# Patient Record
Sex: Male | Born: 1935 | Race: White | Hispanic: No | Marital: Married | Smoking: Never smoker
Health system: Southern US, Community
[De-identification: ages and names within clinical notes are randomized; demographics above are authoritative.]

## PROBLEM LIST (undated history)

## (undated) DIAGNOSIS — H409 Unspecified glaucoma: Secondary | ICD-10-CM

## (undated) HISTORY — DX: Unspecified glaucoma: H40.9

---

## 2011-06-20 DIAGNOSIS — K219 Gastro-esophageal reflux disease without esophagitis: Secondary | ICD-10-CM | POA: Diagnosis not present

## 2011-06-20 DIAGNOSIS — D510 Vitamin B12 deficiency anemia due to intrinsic factor deficiency: Secondary | ICD-10-CM | POA: Diagnosis not present

## 2011-06-20 DIAGNOSIS — I4891 Unspecified atrial fibrillation: Secondary | ICD-10-CM | POA: Diagnosis not present

## 2011-06-20 DIAGNOSIS — Z79899 Other long term (current) drug therapy: Secondary | ICD-10-CM | POA: Diagnosis not present

## 2011-06-20 DIAGNOSIS — Z125 Encounter for screening for malignant neoplasm of prostate: Secondary | ICD-10-CM | POA: Diagnosis not present

## 2011-06-20 DIAGNOSIS — E785 Hyperlipidemia, unspecified: Secondary | ICD-10-CM | POA: Diagnosis not present

## 2011-07-07 DIAGNOSIS — Z6829 Body mass index (BMI) 29.0-29.9, adult: Secondary | ICD-10-CM | POA: Diagnosis not present

## 2011-07-07 DIAGNOSIS — S335XXA Sprain of ligaments of lumbar spine, initial encounter: Secondary | ICD-10-CM | POA: Diagnosis not present

## 2011-07-22 DIAGNOSIS — D510 Vitamin B12 deficiency anemia due to intrinsic factor deficiency: Secondary | ICD-10-CM | POA: Diagnosis not present

## 2011-07-24 DIAGNOSIS — J209 Acute bronchitis, unspecified: Secondary | ICD-10-CM | POA: Diagnosis not present

## 2011-07-24 DIAGNOSIS — J019 Acute sinusitis, unspecified: Secondary | ICD-10-CM | POA: Diagnosis not present

## 2011-07-24 DIAGNOSIS — Z6829 Body mass index (BMI) 29.0-29.9, adult: Secondary | ICD-10-CM | POA: Diagnosis not present

## 2011-08-20 DIAGNOSIS — D510 Vitamin B12 deficiency anemia due to intrinsic factor deficiency: Secondary | ICD-10-CM | POA: Diagnosis not present

## 2011-08-25 DIAGNOSIS — H26499 Other secondary cataract, unspecified eye: Secondary | ICD-10-CM | POA: Diagnosis not present

## 2011-08-25 DIAGNOSIS — H4011X0 Primary open-angle glaucoma, stage unspecified: Secondary | ICD-10-CM | POA: Diagnosis not present

## 2011-09-23 DIAGNOSIS — Z6828 Body mass index (BMI) 28.0-28.9, adult: Secondary | ICD-10-CM | POA: Diagnosis not present

## 2011-09-23 DIAGNOSIS — E785 Hyperlipidemia, unspecified: Secondary | ICD-10-CM | POA: Diagnosis not present

## 2011-09-23 DIAGNOSIS — I4891 Unspecified atrial fibrillation: Secondary | ICD-10-CM | POA: Diagnosis not present

## 2011-09-23 DIAGNOSIS — Z79899 Other long term (current) drug therapy: Secondary | ICD-10-CM | POA: Diagnosis not present

## 2011-09-23 DIAGNOSIS — D510 Vitamin B12 deficiency anemia due to intrinsic factor deficiency: Secondary | ICD-10-CM | POA: Diagnosis not present

## 2011-10-30 DIAGNOSIS — D510 Vitamin B12 deficiency anemia due to intrinsic factor deficiency: Secondary | ICD-10-CM | POA: Diagnosis not present

## 2011-12-01 DIAGNOSIS — D510 Vitamin B12 deficiency anemia due to intrinsic factor deficiency: Secondary | ICD-10-CM | POA: Diagnosis not present

## 2012-01-05 DIAGNOSIS — I4891 Unspecified atrial fibrillation: Secondary | ICD-10-CM | POA: Diagnosis not present

## 2012-01-05 DIAGNOSIS — E785 Hyperlipidemia, unspecified: Secondary | ICD-10-CM | POA: Diagnosis not present

## 2012-01-05 DIAGNOSIS — D510 Vitamin B12 deficiency anemia due to intrinsic factor deficiency: Secondary | ICD-10-CM | POA: Diagnosis not present

## 2012-01-05 DIAGNOSIS — Z6827 Body mass index (BMI) 27.0-27.9, adult: Secondary | ICD-10-CM | POA: Diagnosis not present

## 2012-02-06 DIAGNOSIS — D510 Vitamin B12 deficiency anemia due to intrinsic factor deficiency: Secondary | ICD-10-CM | POA: Diagnosis not present

## 2012-03-08 DIAGNOSIS — D510 Vitamin B12 deficiency anemia due to intrinsic factor deficiency: Secondary | ICD-10-CM | POA: Diagnosis not present

## 2012-04-13 DIAGNOSIS — Z6828 Body mass index (BMI) 28.0-28.9, adult: Secondary | ICD-10-CM | POA: Diagnosis not present

## 2012-04-13 DIAGNOSIS — E785 Hyperlipidemia, unspecified: Secondary | ICD-10-CM | POA: Diagnosis not present

## 2012-04-13 DIAGNOSIS — I4891 Unspecified atrial fibrillation: Secondary | ICD-10-CM | POA: Diagnosis not present

## 2012-04-13 DIAGNOSIS — D510 Vitamin B12 deficiency anemia due to intrinsic factor deficiency: Secondary | ICD-10-CM | POA: Diagnosis not present

## 2012-05-14 DIAGNOSIS — D510 Vitamin B12 deficiency anemia due to intrinsic factor deficiency: Secondary | ICD-10-CM | POA: Diagnosis not present

## 2012-05-21 DIAGNOSIS — J209 Acute bronchitis, unspecified: Secondary | ICD-10-CM | POA: Diagnosis not present

## 2012-05-21 DIAGNOSIS — Z6827 Body mass index (BMI) 27.0-27.9, adult: Secondary | ICD-10-CM | POA: Diagnosis not present

## 2012-05-21 DIAGNOSIS — H6120 Impacted cerumen, unspecified ear: Secondary | ICD-10-CM | POA: Diagnosis not present

## 2012-05-21 DIAGNOSIS — R509 Fever, unspecified: Secondary | ICD-10-CM | POA: Diagnosis not present

## 2012-05-21 DIAGNOSIS — H6690 Otitis media, unspecified, unspecified ear: Secondary | ICD-10-CM | POA: Diagnosis not present

## 2012-06-16 DIAGNOSIS — D510 Vitamin B12 deficiency anemia due to intrinsic factor deficiency: Secondary | ICD-10-CM | POA: Diagnosis not present

## 2012-07-20 DIAGNOSIS — Z125 Encounter for screening for malignant neoplasm of prostate: Secondary | ICD-10-CM | POA: Diagnosis not present

## 2012-07-20 DIAGNOSIS — D510 Vitamin B12 deficiency anemia due to intrinsic factor deficiency: Secondary | ICD-10-CM | POA: Diagnosis not present

## 2012-07-20 DIAGNOSIS — I4891 Unspecified atrial fibrillation: Secondary | ICD-10-CM | POA: Diagnosis not present

## 2012-07-20 DIAGNOSIS — Z6827 Body mass index (BMI) 27.0-27.9, adult: Secondary | ICD-10-CM | POA: Diagnosis not present

## 2012-07-20 DIAGNOSIS — E785 Hyperlipidemia, unspecified: Secondary | ICD-10-CM | POA: Diagnosis not present

## 2012-08-19 DIAGNOSIS — D510 Vitamin B12 deficiency anemia due to intrinsic factor deficiency: Secondary | ICD-10-CM | POA: Diagnosis not present

## 2012-10-28 DIAGNOSIS — D510 Vitamin B12 deficiency anemia due to intrinsic factor deficiency: Secondary | ICD-10-CM | POA: Diagnosis not present

## 2012-10-28 DIAGNOSIS — I4891 Unspecified atrial fibrillation: Secondary | ICD-10-CM | POA: Diagnosis not present

## 2012-10-28 DIAGNOSIS — E785 Hyperlipidemia, unspecified: Secondary | ICD-10-CM | POA: Diagnosis not present

## 2012-10-28 DIAGNOSIS — Z6828 Body mass index (BMI) 28.0-28.9, adult: Secondary | ICD-10-CM | POA: Diagnosis not present

## 2012-11-29 DIAGNOSIS — D510 Vitamin B12 deficiency anemia due to intrinsic factor deficiency: Secondary | ICD-10-CM | POA: Diagnosis not present

## 2012-12-10 DIAGNOSIS — H409 Unspecified glaucoma: Secondary | ICD-10-CM | POA: Diagnosis not present

## 2012-12-10 DIAGNOSIS — H26499 Other secondary cataract, unspecified eye: Secondary | ICD-10-CM | POA: Diagnosis not present

## 2012-12-10 DIAGNOSIS — H4011X0 Primary open-angle glaucoma, stage unspecified: Secondary | ICD-10-CM | POA: Diagnosis not present

## 2012-12-30 DIAGNOSIS — D510 Vitamin B12 deficiency anemia due to intrinsic factor deficiency: Secondary | ICD-10-CM | POA: Diagnosis not present

## 2013-02-04 DIAGNOSIS — Z6828 Body mass index (BMI) 28.0-28.9, adult: Secondary | ICD-10-CM | POA: Diagnosis not present

## 2013-02-04 DIAGNOSIS — Z1331 Encounter for screening for depression: Secondary | ICD-10-CM | POA: Diagnosis not present

## 2013-02-04 DIAGNOSIS — D510 Vitamin B12 deficiency anemia due to intrinsic factor deficiency: Secondary | ICD-10-CM | POA: Diagnosis not present

## 2013-02-04 DIAGNOSIS — E785 Hyperlipidemia, unspecified: Secondary | ICD-10-CM | POA: Diagnosis not present

## 2013-02-04 DIAGNOSIS — K219 Gastro-esophageal reflux disease without esophagitis: Secondary | ICD-10-CM | POA: Diagnosis not present

## 2013-02-04 DIAGNOSIS — Z9181 History of falling: Secondary | ICD-10-CM | POA: Diagnosis not present

## 2013-02-04 DIAGNOSIS — I4891 Unspecified atrial fibrillation: Secondary | ICD-10-CM | POA: Diagnosis not present

## 2013-05-06 DIAGNOSIS — E785 Hyperlipidemia, unspecified: Secondary | ICD-10-CM | POA: Diagnosis not present

## 2013-05-06 DIAGNOSIS — D510 Vitamin B12 deficiency anemia due to intrinsic factor deficiency: Secondary | ICD-10-CM | POA: Diagnosis not present

## 2013-05-06 DIAGNOSIS — I4891 Unspecified atrial fibrillation: Secondary | ICD-10-CM | POA: Diagnosis not present

## 2013-05-06 DIAGNOSIS — Z6828 Body mass index (BMI) 28.0-28.9, adult: Secondary | ICD-10-CM | POA: Diagnosis not present

## 2013-06-13 DIAGNOSIS — H26499 Other secondary cataract, unspecified eye: Secondary | ICD-10-CM | POA: Diagnosis not present

## 2013-06-13 DIAGNOSIS — H4011X0 Primary open-angle glaucoma, stage unspecified: Secondary | ICD-10-CM | POA: Diagnosis not present

## 2013-08-08 DIAGNOSIS — D510 Vitamin B12 deficiency anemia due to intrinsic factor deficiency: Secondary | ICD-10-CM | POA: Diagnosis not present

## 2013-08-08 DIAGNOSIS — E785 Hyperlipidemia, unspecified: Secondary | ICD-10-CM | POA: Diagnosis not present

## 2013-08-08 DIAGNOSIS — I4891 Unspecified atrial fibrillation: Secondary | ICD-10-CM | POA: Diagnosis not present

## 2013-08-08 DIAGNOSIS — Z6829 Body mass index (BMI) 29.0-29.9, adult: Secondary | ICD-10-CM | POA: Diagnosis not present

## 2013-09-20 DIAGNOSIS — J309 Allergic rhinitis, unspecified: Secondary | ICD-10-CM | POA: Diagnosis not present

## 2013-09-20 DIAGNOSIS — J209 Acute bronchitis, unspecified: Secondary | ICD-10-CM | POA: Diagnosis not present

## 2013-09-20 DIAGNOSIS — Z6829 Body mass index (BMI) 29.0-29.9, adult: Secondary | ICD-10-CM | POA: Diagnosis not present

## 2013-10-17 DIAGNOSIS — R0982 Postnasal drip: Secondary | ICD-10-CM | POA: Diagnosis not present

## 2013-10-17 DIAGNOSIS — H6120 Impacted cerumen, unspecified ear: Secondary | ICD-10-CM | POA: Diagnosis not present

## 2013-10-17 DIAGNOSIS — J309 Allergic rhinitis, unspecified: Secondary | ICD-10-CM | POA: Diagnosis not present

## 2013-10-17 DIAGNOSIS — Z6828 Body mass index (BMI) 28.0-28.9, adult: Secondary | ICD-10-CM | POA: Diagnosis not present

## 2013-10-17 DIAGNOSIS — J019 Acute sinusitis, unspecified: Secondary | ICD-10-CM | POA: Diagnosis not present

## 2013-11-16 DIAGNOSIS — D510 Vitamin B12 deficiency anemia due to intrinsic factor deficiency: Secondary | ICD-10-CM | POA: Diagnosis not present

## 2013-11-16 DIAGNOSIS — E785 Hyperlipidemia, unspecified: Secondary | ICD-10-CM | POA: Diagnosis not present

## 2013-11-16 DIAGNOSIS — Z125 Encounter for screening for malignant neoplasm of prostate: Secondary | ICD-10-CM | POA: Diagnosis not present

## 2013-11-16 DIAGNOSIS — Z6828 Body mass index (BMI) 28.0-28.9, adult: Secondary | ICD-10-CM | POA: Diagnosis not present

## 2013-11-16 DIAGNOSIS — I4891 Unspecified atrial fibrillation: Secondary | ICD-10-CM | POA: Diagnosis not present

## 2013-12-13 DIAGNOSIS — H47239 Glaucomatous optic atrophy, unspecified eye: Secondary | ICD-10-CM | POA: Diagnosis not present

## 2014-02-22 DIAGNOSIS — I4891 Unspecified atrial fibrillation: Secondary | ICD-10-CM | POA: Diagnosis not present

## 2014-02-22 DIAGNOSIS — E785 Hyperlipidemia, unspecified: Secondary | ICD-10-CM | POA: Diagnosis not present

## 2014-02-22 DIAGNOSIS — Z6829 Body mass index (BMI) 29.0-29.9, adult: Secondary | ICD-10-CM | POA: Diagnosis not present

## 2014-02-22 DIAGNOSIS — D510 Vitamin B12 deficiency anemia due to intrinsic factor deficiency: Secondary | ICD-10-CM | POA: Diagnosis not present

## 2014-06-05 DIAGNOSIS — E785 Hyperlipidemia, unspecified: Secondary | ICD-10-CM | POA: Diagnosis not present

## 2014-06-05 DIAGNOSIS — Z6829 Body mass index (BMI) 29.0-29.9, adult: Secondary | ICD-10-CM | POA: Diagnosis not present

## 2014-06-05 DIAGNOSIS — Z9181 History of falling: Secondary | ICD-10-CM | POA: Diagnosis not present

## 2014-06-05 DIAGNOSIS — K219 Gastro-esophageal reflux disease without esophagitis: Secondary | ICD-10-CM | POA: Diagnosis not present

## 2014-06-05 DIAGNOSIS — Z2821 Immunization not carried out because of patient refusal: Secondary | ICD-10-CM | POA: Diagnosis not present

## 2014-06-05 DIAGNOSIS — E538 Deficiency of other specified B group vitamins: Secondary | ICD-10-CM | POA: Diagnosis not present

## 2014-06-05 DIAGNOSIS — Z1389 Encounter for screening for other disorder: Secondary | ICD-10-CM | POA: Diagnosis not present

## 2014-06-05 DIAGNOSIS — Z720 Tobacco use: Secondary | ICD-10-CM | POA: Diagnosis not present

## 2014-06-05 DIAGNOSIS — I4891 Unspecified atrial fibrillation: Secondary | ICD-10-CM | POA: Diagnosis not present

## 2014-06-07 DIAGNOSIS — J209 Acute bronchitis, unspecified: Secondary | ICD-10-CM | POA: Diagnosis not present

## 2014-06-07 DIAGNOSIS — Z6829 Body mass index (BMI) 29.0-29.9, adult: Secondary | ICD-10-CM | POA: Diagnosis not present

## 2014-06-20 DIAGNOSIS — H4011X3 Primary open-angle glaucoma, severe stage: Secondary | ICD-10-CM | POA: Diagnosis not present

## 2014-06-29 DIAGNOSIS — Z6829 Body mass index (BMI) 29.0-29.9, adult: Secondary | ICD-10-CM | POA: Diagnosis not present

## 2014-06-29 DIAGNOSIS — J019 Acute sinusitis, unspecified: Secondary | ICD-10-CM | POA: Diagnosis not present

## 2014-06-29 DIAGNOSIS — J208 Acute bronchitis due to other specified organisms: Secondary | ICD-10-CM | POA: Diagnosis not present

## 2014-08-04 DIAGNOSIS — R05 Cough: Secondary | ICD-10-CM | POA: Diagnosis not present

## 2014-08-04 DIAGNOSIS — J449 Chronic obstructive pulmonary disease, unspecified: Secondary | ICD-10-CM | POA: Diagnosis not present

## 2014-08-04 DIAGNOSIS — Z6828 Body mass index (BMI) 28.0-28.9, adult: Secondary | ICD-10-CM | POA: Diagnosis not present

## 2014-08-04 DIAGNOSIS — J208 Acute bronchitis due to other specified organisms: Secondary | ICD-10-CM | POA: Diagnosis not present

## 2014-09-06 DIAGNOSIS — Z6829 Body mass index (BMI) 29.0-29.9, adult: Secondary | ICD-10-CM | POA: Diagnosis not present

## 2014-09-06 DIAGNOSIS — Z720 Tobacco use: Secondary | ICD-10-CM | POA: Diagnosis not present

## 2014-09-06 DIAGNOSIS — E538 Deficiency of other specified B group vitamins: Secondary | ICD-10-CM | POA: Diagnosis not present

## 2014-09-06 DIAGNOSIS — E785 Hyperlipidemia, unspecified: Secondary | ICD-10-CM | POA: Diagnosis not present

## 2014-09-06 DIAGNOSIS — K219 Gastro-esophageal reflux disease without esophagitis: Secondary | ICD-10-CM | POA: Diagnosis not present

## 2014-09-06 DIAGNOSIS — I480 Paroxysmal atrial fibrillation: Secondary | ICD-10-CM | POA: Diagnosis not present

## 2014-12-08 DIAGNOSIS — K219 Gastro-esophageal reflux disease without esophagitis: Secondary | ICD-10-CM | POA: Diagnosis not present

## 2014-12-08 DIAGNOSIS — Z9181 History of falling: Secondary | ICD-10-CM | POA: Diagnosis not present

## 2014-12-08 DIAGNOSIS — E538 Deficiency of other specified B group vitamins: Secondary | ICD-10-CM | POA: Diagnosis not present

## 2014-12-08 DIAGNOSIS — Z125 Encounter for screening for malignant neoplasm of prostate: Secondary | ICD-10-CM | POA: Diagnosis not present

## 2014-12-08 DIAGNOSIS — I480 Paroxysmal atrial fibrillation: Secondary | ICD-10-CM | POA: Diagnosis not present

## 2014-12-08 DIAGNOSIS — L02211 Cutaneous abscess of abdominal wall: Secondary | ICD-10-CM | POA: Diagnosis not present

## 2014-12-08 DIAGNOSIS — Z79899 Other long term (current) drug therapy: Secondary | ICD-10-CM | POA: Diagnosis not present

## 2014-12-08 DIAGNOSIS — Z1389 Encounter for screening for other disorder: Secondary | ICD-10-CM | POA: Diagnosis not present

## 2014-12-08 DIAGNOSIS — E785 Hyperlipidemia, unspecified: Secondary | ICD-10-CM | POA: Diagnosis not present

## 2014-12-08 DIAGNOSIS — Z6829 Body mass index (BMI) 29.0-29.9, adult: Secondary | ICD-10-CM | POA: Diagnosis not present

## 2014-12-08 DIAGNOSIS — Z720 Tobacco use: Secondary | ICD-10-CM | POA: Diagnosis not present

## 2015-01-11 DIAGNOSIS — L821 Other seborrheic keratosis: Secondary | ICD-10-CM | POA: Diagnosis not present

## 2015-01-11 DIAGNOSIS — L820 Inflamed seborrheic keratosis: Secondary | ICD-10-CM | POA: Diagnosis not present

## 2015-01-11 DIAGNOSIS — L578 Other skin changes due to chronic exposure to nonionizing radiation: Secondary | ICD-10-CM | POA: Diagnosis not present

## 2015-01-11 DIAGNOSIS — L720 Epidermal cyst: Secondary | ICD-10-CM | POA: Diagnosis not present

## 2015-02-08 DIAGNOSIS — Z6829 Body mass index (BMI) 29.0-29.9, adult: Secondary | ICD-10-CM | POA: Diagnosis not present

## 2015-02-08 DIAGNOSIS — J019 Acute sinusitis, unspecified: Secondary | ICD-10-CM | POA: Diagnosis not present

## 2015-02-08 DIAGNOSIS — J309 Allergic rhinitis, unspecified: Secondary | ICD-10-CM | POA: Diagnosis not present

## 2015-02-19 DIAGNOSIS — H4011X3 Primary open-angle glaucoma, severe stage: Secondary | ICD-10-CM | POA: Diagnosis not present

## 2015-03-09 DIAGNOSIS — H401132 Primary open-angle glaucoma, bilateral, moderate stage: Secondary | ICD-10-CM | POA: Diagnosis not present

## 2015-03-13 DIAGNOSIS — L820 Inflamed seborrheic keratosis: Secondary | ICD-10-CM | POA: Diagnosis not present

## 2015-03-13 DIAGNOSIS — Z720 Tobacco use: Secondary | ICD-10-CM | POA: Diagnosis not present

## 2015-03-13 DIAGNOSIS — E785 Hyperlipidemia, unspecified: Secondary | ICD-10-CM | POA: Diagnosis not present

## 2015-03-13 DIAGNOSIS — D519 Vitamin B12 deficiency anemia, unspecified: Secondary | ICD-10-CM | POA: Diagnosis not present

## 2015-03-13 DIAGNOSIS — L821 Other seborrheic keratosis: Secondary | ICD-10-CM | POA: Diagnosis not present

## 2015-03-13 DIAGNOSIS — I480 Paroxysmal atrial fibrillation: Secondary | ICD-10-CM | POA: Diagnosis not present

## 2015-03-13 DIAGNOSIS — D234 Other benign neoplasm of skin of scalp and neck: Secondary | ICD-10-CM | POA: Diagnosis not present

## 2015-03-13 DIAGNOSIS — L578 Other skin changes due to chronic exposure to nonionizing radiation: Secondary | ICD-10-CM | POA: Diagnosis not present

## 2015-03-13 DIAGNOSIS — Z6828 Body mass index (BMI) 28.0-28.9, adult: Secondary | ICD-10-CM | POA: Diagnosis not present

## 2015-03-27 DIAGNOSIS — H401133 Primary open-angle glaucoma, bilateral, severe stage: Secondary | ICD-10-CM | POA: Diagnosis not present

## 2015-04-16 ENCOUNTER — Ambulatory Visit (INDEPENDENT_AMBULATORY_CARE_PROVIDER_SITE_OTHER): Payer: Medicare Other | Admitting: Podiatry

## 2015-04-16 ENCOUNTER — Encounter: Payer: Self-pay | Admitting: Podiatry

## 2015-04-16 DIAGNOSIS — M79676 Pain in unspecified toe(s): Secondary | ICD-10-CM | POA: Diagnosis not present

## 2015-04-16 DIAGNOSIS — B351 Tinea unguium: Secondary | ICD-10-CM | POA: Diagnosis not present

## 2015-04-16 NOTE — Progress Notes (Signed)
   Subjective:    Patient ID: Lucas Miller, male    DOB: 1934-06-24, 80 y.o.   MRN: [id number]  HPIThis patient presents to the office with long painful nails.  His nails both feet are painful walking and wearing his shoes.  His wife says she stopped cutting the nails and he has not worked on the nails himself.  He presents to the office for evaluation and treatment. The patient is here today with B/l 2nd toenails that are discolored and for B/L toenail trim.   Review of Systems  HENT:       Sinus problems  Respiratory: Positive for cough.        Objective:   Physical Exam GENERAL APPEARANCE: Alert, conversant. Appropriately groomed. No acute distress.  VASCULAR: Pedal pulses palpable at  [facility] and PT bilateral.  Capillary refill time is immediate to all digits,  Normal temperature gradient.  Digital hair growth is present bilateral  NEUROLOGIC: sensation is normal to 5.07 monofilament at 5/5 sites bilateral.  Light touch is intact bilateral, Muscle strength normal.  MUSCULOSKELETAL: acceptable muscle strength, tone and stability bilateral.  Intrinsic muscluature intact bilateral.  Rectus appearance of foot and digits noted bilateral.   DERMATOLOGIC: skin color, texture, and turgor are within normal limits.  No preulcerative lesions or ulcers  are seen, no interdigital maceration noted.  No open lesions present.  . No drainage noted.  NAILS  Thick disfigured discolored nails both feet especially second toenails both feet.       Assessment & Plan:  Onychomycosis    IE   Debridement and grinding of long painful nails.    Gardiner Barefoot DPM

## 2015-05-22 DIAGNOSIS — Z720 Tobacco use: Secondary | ICD-10-CM | POA: Diagnosis not present

## 2015-05-22 DIAGNOSIS — J208 Acute bronchitis due to other specified organisms: Secondary | ICD-10-CM | POA: Diagnosis not present

## 2015-05-22 DIAGNOSIS — Z6829 Body mass index (BMI) 29.0-29.9, adult: Secondary | ICD-10-CM | POA: Diagnosis not present

## 2015-06-15 DIAGNOSIS — K219 Gastro-esophageal reflux disease without esophagitis: Secondary | ICD-10-CM | POA: Diagnosis not present

## 2015-06-15 DIAGNOSIS — E785 Hyperlipidemia, unspecified: Secondary | ICD-10-CM | POA: Diagnosis not present

## 2015-06-15 DIAGNOSIS — Z6828 Body mass index (BMI) 28.0-28.9, adult: Secondary | ICD-10-CM | POA: Diagnosis not present

## 2015-06-15 DIAGNOSIS — I480 Paroxysmal atrial fibrillation: Secondary | ICD-10-CM | POA: Diagnosis not present

## 2015-06-15 DIAGNOSIS — Z720 Tobacco use: Secondary | ICD-10-CM | POA: Diagnosis not present

## 2015-06-15 DIAGNOSIS — D519 Vitamin B12 deficiency anemia, unspecified: Secondary | ICD-10-CM | POA: Diagnosis not present

## 2015-07-23 ENCOUNTER — Ambulatory Visit (INDEPENDENT_AMBULATORY_CARE_PROVIDER_SITE_OTHER): Payer: Medicare Other | Admitting: Podiatry

## 2015-07-23 ENCOUNTER — Encounter: Payer: Self-pay | Admitting: Podiatry

## 2015-07-23 DIAGNOSIS — B351 Tinea unguium: Secondary | ICD-10-CM | POA: Diagnosis not present

## 2015-07-23 DIAGNOSIS — M79676 Pain in unspecified toe(s): Secondary | ICD-10-CM

## 2015-07-23 NOTE — Progress Notes (Signed)
   Subjective:    Patient ID: Lucas Miller, male    DOB: 1934/07/19, 81 y.o.   MRN: [id number]  HPIThis patient presents to the office with long painful nails.  His nails both feet are painful walking and wearing his shoes.  His wife says she stopped cutting the nails and he has not worked on the nails himself.  He presents to the office for evaluation and treatment. The patient is here today with B/l 2nd toenails that are discolored and for B/L toenail trim.   Review of Systems  HENT:       Sinus problems  Respiratory: Positive for cough.        Objective:   Physical Exam GENERAL APPEARANCE: Alert, conversant. Appropriately groomed. No acute distress.  VASCULAR: Pedal pulses palpable at  [facility] and PT bilateral.  Capillary refill time is immediate to all digits,  Normal temperature gradient.  Digital hair growth is present bilateral  NEUROLOGIC: sensation is normal to 5.07 monofilament at 5/5 sites bilateral.  Light touch is intact bilateral, Muscle strength normal.  MUSCULOSKELETAL: acceptable muscle strength, tone and stability bilateral.  Intrinsic muscluature intact bilateral.  Rectus appearance of foot and digits noted bilateral.   DERMATOLOGIC: skin color, texture, and turgor are within normal limits.  No preulcerative lesions or ulcers  are seen, no interdigital maceration noted.  No open lesions present.  . No drainage noted.  NAILS  Thick disfigured discolored nails both feet especially second toenails both feet.       Assessment & Plan:  Onychomycosis    IE   Debridement and grinding of long painful nails.    Gardiner Barefoot DPM

## 2015-07-30 DIAGNOSIS — J101 Influenza due to other identified influenza virus with other respiratory manifestations: Secondary | ICD-10-CM | POA: Diagnosis not present

## 2015-07-30 DIAGNOSIS — Z6828 Body mass index (BMI) 28.0-28.9, adult: Secondary | ICD-10-CM | POA: Diagnosis not present

## 2015-08-17 DIAGNOSIS — J208 Acute bronchitis due to other specified organisms: Secondary | ICD-10-CM | POA: Diagnosis not present

## 2015-08-17 DIAGNOSIS — Z6828 Body mass index (BMI) 28.0-28.9, adult: Secondary | ICD-10-CM | POA: Diagnosis not present

## 2015-08-17 DIAGNOSIS — Z720 Tobacco use: Secondary | ICD-10-CM | POA: Diagnosis not present

## 2015-09-21 DIAGNOSIS — E663 Overweight: Secondary | ICD-10-CM | POA: Diagnosis not present

## 2015-09-21 DIAGNOSIS — R05 Cough: Secondary | ICD-10-CM | POA: Diagnosis not present

## 2015-09-21 DIAGNOSIS — R918 Other nonspecific abnormal finding of lung field: Secondary | ICD-10-CM | POA: Diagnosis not present

## 2015-09-21 DIAGNOSIS — I480 Paroxysmal atrial fibrillation: Secondary | ICD-10-CM | POA: Diagnosis not present

## 2015-09-21 DIAGNOSIS — E785 Hyperlipidemia, unspecified: Secondary | ICD-10-CM | POA: Diagnosis not present

## 2015-09-21 DIAGNOSIS — K219 Gastro-esophageal reflux disease without esophagitis: Secondary | ICD-10-CM | POA: Diagnosis not present

## 2015-09-21 DIAGNOSIS — Z720 Tobacco use: Secondary | ICD-10-CM | POA: Diagnosis not present

## 2015-09-21 DIAGNOSIS — H6123 Impacted cerumen, bilateral: Secondary | ICD-10-CM | POA: Diagnosis not present

## 2015-09-21 DIAGNOSIS — Z6827 Body mass index (BMI) 27.0-27.9, adult: Secondary | ICD-10-CM | POA: Diagnosis not present

## 2015-09-21 DIAGNOSIS — D519 Vitamin B12 deficiency anemia, unspecified: Secondary | ICD-10-CM | POA: Diagnosis not present

## 2015-09-25 DIAGNOSIS — H401133 Primary open-angle glaucoma, bilateral, severe stage: Secondary | ICD-10-CM | POA: Diagnosis not present

## 2015-10-01 ENCOUNTER — Ambulatory Visit (INDEPENDENT_AMBULATORY_CARE_PROVIDER_SITE_OTHER): Payer: Medicare Other | Admitting: Podiatry

## 2015-10-01 ENCOUNTER — Encounter: Payer: Self-pay | Admitting: Podiatry

## 2015-10-01 DIAGNOSIS — M79676 Pain in unspecified toe(s): Secondary | ICD-10-CM | POA: Diagnosis not present

## 2015-10-01 DIAGNOSIS — B351 Tinea unguium: Secondary | ICD-10-CM | POA: Diagnosis not present

## 2015-10-01 NOTE — Progress Notes (Signed)
   Subjective:    Patient ID: Lucas Miller, male    DOB: 1934/09/23, 81 y.o.   MRN: [id number]  HPIThis patient presents to the office with long painful nails.  His nails both feet are painful walking and wearing his shoes.  His wife says she stopped cutting the nails and he has not worked on the nails himself.  He presents to the office for evaluation and treatment. The patient is here today with B/l 2nd toenails that are discolored and for B/L toenail trim.   Review of Systems  HENT:       Sinus problems  Respiratory: Positive for cough.        Objective:   Physical Exam GENERAL APPEARANCE: Alert, conversant. Appropriately groomed. No acute distress.  VASCULAR: Pedal pulses palpable at  [facility] and PT bilateral.  Capillary refill time is immediate to all digits,  Normal temperature gradient.  Digital hair growth is present bilateral  NEUROLOGIC: sensation is normal to 5.07 monofilament at 5/5 sites bilateral.  Light touch is intact bilateral, Muscle strength normal.  MUSCULOSKELETAL: acceptable muscle strength, tone and stability bilateral.  Intrinsic muscluature intact bilateral.  Rectus appearance of foot and digits noted bilateral.   DERMATOLOGIC: skin color, texture, and turgor are within normal limits.  No preulcerative lesions or ulcers  are seen, no interdigital maceration noted.  No open lesions present.  . No drainage noted.  NAILS  Thick disfigured discolored nails both feet especially second toenails both feet.       Assessment & Plan:  Onychomycosis    IE   Debridement and grinding of long painful nails.    Gardiner Barefoot DPM

## 2015-12-10 ENCOUNTER — Encounter: Payer: Self-pay | Admitting: Podiatry

## 2015-12-10 ENCOUNTER — Ambulatory Visit (INDEPENDENT_AMBULATORY_CARE_PROVIDER_SITE_OTHER): Payer: Medicare Other | Admitting: Podiatry

## 2015-12-10 DIAGNOSIS — M79676 Pain in unspecified toe(s): Secondary | ICD-10-CM | POA: Diagnosis not present

## 2015-12-10 DIAGNOSIS — B351 Tinea unguium: Secondary | ICD-10-CM | POA: Diagnosis not present

## 2015-12-10 NOTE — Progress Notes (Signed)
   Subjective:    Patient ID: Lucas Miller, male    DOB: 1935-04-29, 81 y.o.   MRN: [id number]  HPIThis patient presents to the office with long painful nails.  His nails both feet are painful walking and wearing his shoes.  His wife says she stopped cutting the nails and he has not worked on the nails himself.  He presents to the office for evaluation and treatment. The patient is here today with B/l 2nd toenails that are discolored and for B/L toenail trim.   Review of Systems  HENT:       Sinus problems  Respiratory: Positive for cough.        Objective:   Physical Exam GENERAL APPEARANCE: Alert, conversant. Appropriately groomed. No acute distress.  VASCULAR: Pedal pulses palpable at  [facility] and PT bilateral.  Capillary refill time is immediate to all digits,  Normal temperature gradient.  Digital hair growth is present bilateral  NEUROLOGIC: sensation is normal to 5.07 monofilament at 5/5 sites bilateral.  Light touch is intact bilateral, Muscle strength normal.  MUSCULOSKELETAL: acceptable muscle strength, tone and stability bilateral.  Intrinsic muscluature intact bilateral.  Rectus appearance of foot and digits noted bilateral.   DERMATOLOGIC: skin color, texture, and turgor are within normal limits.  No preulcerative lesions or ulcers  are seen, no interdigital maceration noted.  No open lesions present.  . No drainage noted.  NAILS  Thick disfigured discolored nails both feet especially second toenails both feet.       Assessment & Plan:  Onychomycosis    IE   Debridement and grinding of long painful nails.    Gardiner Barefoot DPM

## 2015-12-25 DIAGNOSIS — Z720 Tobacco use: Secondary | ICD-10-CM | POA: Diagnosis not present

## 2015-12-25 DIAGNOSIS — Z125 Encounter for screening for malignant neoplasm of prostate: Secondary | ICD-10-CM | POA: Diagnosis not present

## 2015-12-25 DIAGNOSIS — E785 Hyperlipidemia, unspecified: Secondary | ICD-10-CM | POA: Diagnosis not present

## 2015-12-25 DIAGNOSIS — I480 Paroxysmal atrial fibrillation: Secondary | ICD-10-CM | POA: Diagnosis not present

## 2015-12-25 DIAGNOSIS — Z6829 Body mass index (BMI) 29.0-29.9, adult: Secondary | ICD-10-CM | POA: Diagnosis not present

## 2015-12-25 DIAGNOSIS — D519 Vitamin B12 deficiency anemia, unspecified: Secondary | ICD-10-CM | POA: Diagnosis not present

## 2015-12-25 DIAGNOSIS — K219 Gastro-esophageal reflux disease without esophagitis: Secondary | ICD-10-CM | POA: Diagnosis not present

## 2016-01-29 DIAGNOSIS — H401133 Primary open-angle glaucoma, bilateral, severe stage: Secondary | ICD-10-CM | POA: Diagnosis not present

## 2016-01-29 DIAGNOSIS — H53461 Homonymous bilateral field defects, right side: Secondary | ICD-10-CM | POA: Diagnosis not present

## 2016-02-18 ENCOUNTER — Ambulatory Visit (INDEPENDENT_AMBULATORY_CARE_PROVIDER_SITE_OTHER): Payer: Medicare Other | Admitting: Podiatry

## 2016-02-18 ENCOUNTER — Encounter: Payer: Self-pay | Admitting: Podiatry

## 2016-02-18 VITALS — Ht 72.0 in | Wt 195.0 lb

## 2016-02-18 DIAGNOSIS — M79676 Pain in unspecified toe(s): Secondary | ICD-10-CM

## 2016-02-18 DIAGNOSIS — B351 Tinea unguium: Secondary | ICD-10-CM

## 2016-02-18 NOTE — Progress Notes (Signed)
   Subjective:    Patient ID: Lucas Miller, male    DOB: [birth year], 81 y.o.   MRN: [id number]  HPIThis patient presents to the office with long painful nails.  His nails both feet are painful walking and wearing his shoes.  His wife says she stopped cutting the nails and he has not worked on the nails himself.  He presents to the office for evaluation and treatment. The patient is here today with B/l 2nd toenails that are discolored and for B/L toenail trim.   Review of Systems  HENT:       Sinus problems  Respiratory: Positive for cough.        Objective:   Physical Exam GENERAL APPEARANCE: Alert, conversant. Appropriately groomed. No acute distress.  VASCULAR: Pedal pulses palpable at  [facility] and PT bilateral.  Capillary refill time is immediate to all digits,  Normal temperature gradient.  Digital hair growth is present bilateral  NEUROLOGIC: sensation is normal to 5.07 monofilament at 5/5 sites bilateral.  Light touch is intact bilateral, Muscle strength normal.  MUSCULOSKELETAL: acceptable muscle strength, tone and stability bilateral.  Intrinsic muscluature intact bilateral.  Rectus appearance of foot and digits noted bilateral.   DERMATOLOGIC: skin color, texture, and turgor are within normal limits.  No preulcerative lesions or ulcers  are seen, no interdigital maceration noted.  No open lesions present.  . No drainage noted.  NAILS  Thick disfigured discolored nails both feet especially second toenails both feet.       Assessment & Plan:  Onychomycosis    IE   Debridement and grinding of long painful nails.    Gardiner Barefoot DPM

## 2016-02-21 DIAGNOSIS — Z6828 Body mass index (BMI) 28.0-28.9, adult: Secondary | ICD-10-CM | POA: Diagnosis not present

## 2016-02-21 DIAGNOSIS — J208 Acute bronchitis due to other specified organisms: Secondary | ICD-10-CM | POA: Diagnosis not present

## 2016-02-21 DIAGNOSIS — Z720 Tobacco use: Secondary | ICD-10-CM | POA: Diagnosis not present

## 2016-02-21 DIAGNOSIS — H1013 Acute atopic conjunctivitis, bilateral: Secondary | ICD-10-CM | POA: Diagnosis not present

## 2016-03-19 DIAGNOSIS — H401133 Primary open-angle glaucoma, bilateral, severe stage: Secondary | ICD-10-CM | POA: Diagnosis not present

## 2016-03-31 DIAGNOSIS — L03115 Cellulitis of right lower limb: Secondary | ICD-10-CM | POA: Diagnosis not present

## 2016-03-31 DIAGNOSIS — Z6828 Body mass index (BMI) 28.0-28.9, adult: Secondary | ICD-10-CM | POA: Diagnosis not present

## 2016-03-31 DIAGNOSIS — Z720 Tobacco use: Secondary | ICD-10-CM | POA: Diagnosis not present

## 2016-03-31 DIAGNOSIS — J208 Acute bronchitis due to other specified organisms: Secondary | ICD-10-CM | POA: Diagnosis not present

## 2016-04-02 DIAGNOSIS — D519 Vitamin B12 deficiency anemia, unspecified: Secondary | ICD-10-CM | POA: Diagnosis not present

## 2016-04-02 DIAGNOSIS — L03115 Cellulitis of right lower limb: Secondary | ICD-10-CM | POA: Diagnosis not present

## 2016-04-02 DIAGNOSIS — Z9181 History of falling: Secondary | ICD-10-CM | POA: Diagnosis not present

## 2016-04-02 DIAGNOSIS — J208 Acute bronchitis due to other specified organisms: Secondary | ICD-10-CM | POA: Diagnosis not present

## 2016-04-02 DIAGNOSIS — Z6827 Body mass index (BMI) 27.0-27.9, adult: Secondary | ICD-10-CM | POA: Diagnosis not present

## 2016-04-02 DIAGNOSIS — Z720 Tobacco use: Secondary | ICD-10-CM | POA: Diagnosis not present

## 2016-04-02 DIAGNOSIS — Z1389 Encounter for screening for other disorder: Secondary | ICD-10-CM | POA: Diagnosis not present

## 2016-04-02 DIAGNOSIS — E785 Hyperlipidemia, unspecified: Secondary | ICD-10-CM | POA: Diagnosis not present

## 2016-04-02 DIAGNOSIS — K219 Gastro-esophageal reflux disease without esophagitis: Secondary | ICD-10-CM | POA: Diagnosis not present

## 2016-04-02 DIAGNOSIS — I480 Paroxysmal atrial fibrillation: Secondary | ICD-10-CM | POA: Diagnosis not present

## 2016-04-28 ENCOUNTER — Ambulatory Visit: Payer: Medicare Other | Admitting: Podiatry

## 2016-04-28 ENCOUNTER — Ambulatory Visit: Payer: No Typology Code available for payment source | Admitting: Podiatry

## 2016-05-07 ENCOUNTER — Ambulatory Visit (INDEPENDENT_AMBULATORY_CARE_PROVIDER_SITE_OTHER): Payer: Medicare Other | Admitting: Sports Medicine

## 2016-05-07 ENCOUNTER — Encounter: Payer: Self-pay | Admitting: Sports Medicine

## 2016-05-07 DIAGNOSIS — M79676 Pain in unspecified toe(s): Secondary | ICD-10-CM | POA: Diagnosis not present

## 2016-05-07 DIAGNOSIS — B351 Tinea unguium: Secondary | ICD-10-CM | POA: Diagnosis not present

## 2016-05-07 DIAGNOSIS — I739 Peripheral vascular disease, unspecified: Secondary | ICD-10-CM

## 2016-05-07 NOTE — Progress Notes (Signed)
Subjective: Lucas Miller is a 81 y.o. male patient seen today in office with complaint of painful thickened and elongated toenails; unable to trim. Patient denies history of Diabetes, Neuropathy, or known Vascular disease. Patient has no other pedal complaints at this time.   There are no active problems to display for this patient.   Current Outpatient Prescriptions on File Prior to Visit  Medication Sig Dispense Refill  . aspirin 81 MG tablet Take 81 mg by mouth daily.    . COMBIGAN 0.2-0.5 % ophthalmic solution PUT 1 DROP INTO INTO BOTH EYES TWICE A DAY  5  . FOLPLEX 2.2 2.2-25-0.5 MG TABS Take 1 tablet by mouth daily.  12  . LUMIGAN 0.01 % SOLN PLACE 1 DROP IN EACH AFFECTED EYE(S) DAILY IN THE EVENING  12  . omeprazole (PRILOSEC) 20 MG capsule Take 20 mg by mouth 2 (two) times daily.  12  . simvastatin (ZOCOR) 40 MG tablet Take 40 mg by mouth at bedtime.  5   No current facility-administered medications on file prior to visit.     Allergies  Allergen Reactions  . Codeine   . Sulfa Antibiotics     Objective: Physical Exam  General: Well developed, nourished, no acute distress, awake, alert and oriented x 3  Vascular: Dorsalis pedis artery 1 out of 4 bilateral, Posterior tibial artery 1 out of 4 bilateral, skin temperature warm to warm proximal to distal bilateral lower extremities, there is varicosities, scant pedal hair present bilateral. Trace edema noted to both legs bilateral.  Neurological: Gross sensation present via light touch bilateral.   Dermatological: Skin is warm, dry, and supple bilateral, Nails 1-10 are tender, long, thick, and discolored with mild subungal debris, no webspace macerations present bilateral, no open lesions present bilateral, no callus/corns/hyperkeratotic tissue present bilateral. No signs of infection bilateral.  Musculoskeletal: No symptomatic boney deformities noted bilateral. Muscular strength within normal limits without painon range of  motion. No pain with calf compression bilateral.  Assessment and Plan:  Problem List Items Addressed This Visit    None    Visit Diagnoses    Pain due to onychomycosis of toenail    -  Primary   PVD (peripheral vascular disease) ([place])          -Examined patient.  -Discussed treatment options for painful mycotic nails. -Mechanically debrided and reduced mycotic nails with sterile nail nipper and dremel nail file without incident. -Encouraged elevation of legs daily to assist with edema control -Patient to return in 3 months for follow up evaluation or sooner if symptoms worsen.  Landis Martins, DPM

## 2016-05-08 DIAGNOSIS — Z6828 Body mass index (BMI) 28.0-28.9, adult: Secondary | ICD-10-CM | POA: Diagnosis not present

## 2016-05-08 DIAGNOSIS — J208 Acute bronchitis due to other specified organisms: Secondary | ICD-10-CM | POA: Diagnosis not present

## 2016-05-08 DIAGNOSIS — E538 Deficiency of other specified B group vitamins: Secondary | ICD-10-CM | POA: Diagnosis not present

## 2016-05-08 DIAGNOSIS — R600 Localized edema: Secondary | ICD-10-CM | POA: Diagnosis not present

## 2016-05-08 DIAGNOSIS — L03115 Cellulitis of right lower limb: Secondary | ICD-10-CM | POA: Diagnosis not present

## 2016-05-09 DIAGNOSIS — R05 Cough: Secondary | ICD-10-CM | POA: Diagnosis not present

## 2016-05-09 DIAGNOSIS — J208 Acute bronchitis due to other specified organisms: Secondary | ICD-10-CM | POA: Diagnosis not present

## 2016-05-09 DIAGNOSIS — R600 Localized edema: Secondary | ICD-10-CM | POA: Diagnosis not present

## 2016-05-09 DIAGNOSIS — M7989 Other specified soft tissue disorders: Secondary | ICD-10-CM | POA: Diagnosis not present

## 2016-05-09 DIAGNOSIS — R0602 Shortness of breath: Secondary | ICD-10-CM | POA: Diagnosis not present

## 2016-07-04 DIAGNOSIS — Z720 Tobacco use: Secondary | ICD-10-CM | POA: Diagnosis not present

## 2016-07-04 DIAGNOSIS — K219 Gastro-esophageal reflux disease without esophagitis: Secondary | ICD-10-CM | POA: Diagnosis not present

## 2016-07-04 DIAGNOSIS — I480 Paroxysmal atrial fibrillation: Secondary | ICD-10-CM | POA: Diagnosis not present

## 2016-07-04 DIAGNOSIS — D519 Vitamin B12 deficiency anemia, unspecified: Secondary | ICD-10-CM | POA: Diagnosis not present

## 2016-07-04 DIAGNOSIS — J069 Acute upper respiratory infection, unspecified: Secondary | ICD-10-CM | POA: Diagnosis not present

## 2016-07-04 DIAGNOSIS — Z6829 Body mass index (BMI) 29.0-29.9, adult: Secondary | ICD-10-CM | POA: Diagnosis not present

## 2016-07-04 DIAGNOSIS — E785 Hyperlipidemia, unspecified: Secondary | ICD-10-CM | POA: Diagnosis not present

## 2016-07-23 DIAGNOSIS — H401133 Primary open-angle glaucoma, bilateral, severe stage: Secondary | ICD-10-CM | POA: Diagnosis not present

## 2016-07-23 DIAGNOSIS — H53461 Homonymous bilateral field defects, right side: Secondary | ICD-10-CM | POA: Diagnosis not present

## 2016-08-08 ENCOUNTER — Ambulatory Visit: Payer: Medicare Other | Admitting: Sports Medicine

## 2016-08-21 DIAGNOSIS — L219 Seborrheic dermatitis, unspecified: Secondary | ICD-10-CM | POA: Diagnosis not present

## 2016-08-21 DIAGNOSIS — L300 Nummular dermatitis: Secondary | ICD-10-CM | POA: Diagnosis not present

## 2016-08-22 ENCOUNTER — Encounter: Payer: Self-pay | Admitting: Sports Medicine

## 2016-08-22 ENCOUNTER — Ambulatory Visit (INDEPENDENT_AMBULATORY_CARE_PROVIDER_SITE_OTHER): Payer: Medicare Other | Admitting: Sports Medicine

## 2016-08-22 DIAGNOSIS — B351 Tinea unguium: Secondary | ICD-10-CM | POA: Diagnosis not present

## 2016-08-22 DIAGNOSIS — M79676 Pain in unspecified toe(s): Secondary | ICD-10-CM

## 2016-08-22 DIAGNOSIS — I739 Peripheral vascular disease, unspecified: Secondary | ICD-10-CM

## 2016-08-22 NOTE — Progress Notes (Signed)
Subjective: Lucas Miller is a 82 y.o. male patient seen today in office with complaint of painful thickened and elongated toenails; unable to trim. Patient denies any changes since last visit with medical history. Patient has no other pedal complaints at this time.   There are no active problems to display for this patient.   Current Outpatient Prescriptions on File Prior to Visit  Medication Sig Dispense Refill  . aspirin 81 MG tablet Take 81 mg by mouth daily.    . COMBIGAN 0.2-0.5 % ophthalmic solution PUT 1 DROP INTO INTO BOTH EYES TWICE A DAY  5  . FOLPLEX 2.2 2.2-25-0.5 MG TABS Take 1 tablet by mouth daily.  12  . LUMIGAN 0.01 % SOLN PLACE 1 DROP IN EACH AFFECTED EYE(S) DAILY IN THE EVENING  12  . omeprazole (PRILOSEC) 20 MG capsule Take 20 mg by mouth 2 (two) times daily.  12  . simvastatin (ZOCOR) 40 MG tablet Take 40 mg by mouth at bedtime.  5   No current facility-administered medications on file prior to visit.     Allergies  Allergen Reactions  . Codeine   . Sulfa Antibiotics     Objective: Physical Exam  General: Well developed, nourished, no acute distress, awake, alert and oriented x 3  Vascular: Dorsalis pedis artery 1 out of 4 bilateral, Posterior tibial artery 1 out of 4 bilateral, skin temperature warm to warm proximal to distal bilateral lower extremities, there is varicosities, scant pedal hair present bilateral. Trace edema noted to both legs bilateral.  Neurological: Gross sensation present via light touch bilateral.   Dermatological: Skin is warm, dry, and supple bilateral, Nails 1-10 are tender, long, thick, and discolored with mild subungal debris, no webspace macerations present bilateral, no open lesions present bilateral, no callus/corns/hyperkeratotic tissue present bilateral. No signs of infection bilateral.  Musculoskeletal: No symptomatic boney deformities noted bilateral. Muscular strength within normal limits without painon range of motion. No  pain with calf compression bilateral.  Assessment and Plan:  Problem List Items Addressed This Visit    None    Visit Diagnoses    Pain due to onychomycosis of toenail    -  Primary   PVD (peripheral vascular disease) ([place])         -Examined patient.  -Discussed treatment options for painful mycotic nails. -Mechanically debrided and reduced mycotic nails with sterile nail nipper and dremel nail file without incident. -Encouraged continued elevation of legs daily to assist with edema control -Patient to return in 2.5 months for follow up evaluation or sooner if symptoms worsen.  Landis Martins, DPM

## 2016-09-03 DIAGNOSIS — H401133 Primary open-angle glaucoma, bilateral, severe stage: Secondary | ICD-10-CM | POA: Diagnosis not present

## 2016-10-08 DIAGNOSIS — R7301 Impaired fasting glucose: Secondary | ICD-10-CM | POA: Diagnosis not present

## 2016-10-08 DIAGNOSIS — K219 Gastro-esophageal reflux disease without esophagitis: Secondary | ICD-10-CM | POA: Diagnosis not present

## 2016-10-08 DIAGNOSIS — Z6827 Body mass index (BMI) 27.0-27.9, adult: Secondary | ICD-10-CM | POA: Diagnosis not present

## 2016-10-08 DIAGNOSIS — D519 Vitamin B12 deficiency anemia, unspecified: Secondary | ICD-10-CM | POA: Diagnosis not present

## 2016-10-08 DIAGNOSIS — J302 Other seasonal allergic rhinitis: Secondary | ICD-10-CM | POA: Diagnosis not present

## 2016-10-08 DIAGNOSIS — I480 Paroxysmal atrial fibrillation: Secondary | ICD-10-CM | POA: Diagnosis not present

## 2016-10-08 DIAGNOSIS — E785 Hyperlipidemia, unspecified: Secondary | ICD-10-CM | POA: Diagnosis not present

## 2016-10-31 ENCOUNTER — Ambulatory Visit (INDEPENDENT_AMBULATORY_CARE_PROVIDER_SITE_OTHER): Payer: Medicare Other | Admitting: Sports Medicine

## 2016-10-31 DIAGNOSIS — M79676 Pain in unspecified toe(s): Secondary | ICD-10-CM | POA: Diagnosis not present

## 2016-10-31 DIAGNOSIS — B351 Tinea unguium: Secondary | ICD-10-CM | POA: Diagnosis not present

## 2016-10-31 DIAGNOSIS — I739 Peripheral vascular disease, unspecified: Secondary | ICD-10-CM

## 2016-10-31 NOTE — Progress Notes (Signed)
Subjective: Lucas Miller is a 82 y.o. male patient seen today in office with complaint of painful thickened and elongated toenails; unable to trim. Patient denies any changes since last visit with medical history. Patient has no other pedal complaints at this time.   Admits to upcoming eye doctors appt.   There are no active problems to display for this patient.   Current Outpatient Prescriptions on File Prior to Visit  Medication Sig Dispense Refill  . aspirin 81 MG tablet Take 81 mg by mouth daily.    . COMBIGAN 0.2-0.5 % ophthalmic solution PUT 1 DROP INTO INTO BOTH EYES TWICE A DAY  5  . dorzolamide (TRUSOPT) 2 % ophthalmic solution     . folic acid (FOLVITE) 1 MG tablet   1  . FOLPLEX 2.2 2.2-25-0.5 MG TABS Take 1 tablet by mouth daily.  12  . LUMIGAN 0.01 % SOLN PLACE 1 DROP IN EACH AFFECTED EYE(S) DAILY IN THE EVENING  12  . omeprazole (PRILOSEC) 20 MG capsule Take 20 mg by mouth 2 (two) times daily.  12  . simvastatin (ZOCOR) 40 MG tablet Take 40 mg by mouth at bedtime.  5  . triamcinolone cream (KENALOG) 0.1 %     . doxycycline (VIBRAMYCIN) 100 MG capsule      No current facility-administered medications on file prior to visit.     Allergies  Allergen Reactions  . Codeine   . Sulfa Antibiotics     Objective: Physical Exam  General: Well developed, nourished, no acute distress, awake, alert and oriented x 3  Vascular: Dorsalis pedis artery 1 out of 4 bilateral, Posterior tibial artery 1 out of 4 bilateral, skin temperature warm to warm proximal to distal bilateral lower extremities, there is varicosities, scant pedal hair present bilateral. Trace edema noted to both legs bilateral.  Neurological: Gross sensation present via light touch bilateral.   Dermatological: Skin is warm, dry, and supple bilateral, Nails 1-10 are tender, long, thick, and discolored with mild subungal debris, no webspace macerations present bilateral, no open lesions present bilateral, no  callus/corns/hyperkeratotic tissue present bilateral. No signs of infection bilateral.  Musculoskeletal: No symptomatic boney deformities noted bilateral. Muscular strength within normal limits without painon range of motion. No pain with calf compression bilateral.  Assessment and Plan:  Problem List Items Addressed This Visit    None    Visit Diagnoses    Pain due to onychomycosis of toenail    -  Primary   PVD (peripheral vascular disease) ([place])         -Examined patient.  -Discussed treatment options for painful mycotic nails. -Mechanically debrided and reduced mycotic nails with sterile nail nipper and dremel nail file without incident. -Encouraged continued elevation of legs daily to assist with edema control -Patient to return in 2.5 months for follow up evaluation or sooner if symptoms worsen.  Landis Martins, DPM

## 2016-11-25 DIAGNOSIS — Z1389 Encounter for screening for other disorder: Secondary | ICD-10-CM | POA: Diagnosis not present

## 2016-11-25 DIAGNOSIS — Z9181 History of falling: Secondary | ICD-10-CM | POA: Diagnosis not present

## 2016-11-25 DIAGNOSIS — Z125 Encounter for screening for malignant neoplasm of prostate: Secondary | ICD-10-CM | POA: Diagnosis not present

## 2016-11-25 DIAGNOSIS — E785 Hyperlipidemia, unspecified: Secondary | ICD-10-CM | POA: Diagnosis not present

## 2016-11-25 DIAGNOSIS — Z0000 Encounter for general adult medical examination without abnormal findings: Secondary | ICD-10-CM | POA: Diagnosis not present

## 2017-01-09 ENCOUNTER — Ambulatory Visit (INDEPENDENT_AMBULATORY_CARE_PROVIDER_SITE_OTHER): Payer: Medicare Other | Admitting: Sports Medicine

## 2017-01-09 DIAGNOSIS — B351 Tinea unguium: Secondary | ICD-10-CM | POA: Diagnosis not present

## 2017-01-09 DIAGNOSIS — M79676 Pain in unspecified toe(s): Secondary | ICD-10-CM

## 2017-01-09 DIAGNOSIS — I739 Peripheral vascular disease, unspecified: Secondary | ICD-10-CM

## 2017-01-09 NOTE — Progress Notes (Signed)
Subjective: Lucas Miller is a 82 y.o. male patient seen today in office with complaint of painful thickened and elongated toenails; unable to trim. Patient denies any changes since last visit with medical history. Patient has no other pedal complaints at this time.    There are no active problems to display for this patient.   Current Outpatient Prescriptions on File Prior to Visit  Medication Sig Dispense Refill  . aspirin 81 MG tablet Take 81 mg by mouth daily.    . COMBIGAN 0.2-0.5 % ophthalmic solution PUT 1 DROP INTO INTO BOTH EYES TWICE A DAY  5  . dorzolamide (TRUSOPT) 2 % ophthalmic solution     . doxycycline (VIBRAMYCIN) 100 MG capsule     . folic acid (FOLVITE) 1 MG tablet   1  . FOLPLEX 2.2 2.2-25-0.5 MG TABS Take 1 tablet by mouth daily.  12  . LUMIGAN 0.01 % SOLN PLACE 1 DROP IN EACH AFFECTED EYE(S) DAILY IN THE EVENING  12  . omeprazole (PRILOSEC) 20 MG capsule Take 20 mg by mouth 2 (two) times daily.  12  . simvastatin (ZOCOR) 40 MG tablet Take 40 mg by mouth at bedtime.  5  . triamcinolone cream (KENALOG) 0.1 %      No current facility-administered medications on file prior to visit.     Allergies  Allergen Reactions  . Codeine   . Sulfa Antibiotics     Objective: Physical Exam  General: Well developed, nourished, no acute distress, awake, alert and oriented x 3  Vascular: Dorsalis pedis artery 1 out of 4 bilateral, Posterior tibial artery 1 out of 4 bilateral, skin temperature warm to warm proximal to distal bilateral lower extremities, there is varicosities, scant pedal hair present bilateral. Trace edema noted to both legs bilateral.  Neurological: Gross sensation present via light touch bilateral.   Dermatological: Skin is warm, dry, and supple bilateral, Nails 1-10 are tender, long, thick, and discolored with mild subungal debris, no webspace macerations present bilateral, no open lesions present bilateral, no callus/corns/hyperkeratotic tissue present  bilateral. No signs of infection bilateral.  Musculoskeletal: No symptomatic boney deformities noted bilateral. Muscular strength within normal limits without painon range of motion. No pain with calf compression bilateral.  Assessment and Plan:  Problem List Items Addressed This Visit    None    Visit Diagnoses    Pain due to onychomycosis of toenail    -  Primary   PVD (peripheral vascular disease) ([place])         -Examined patient.  -Discussed treatment options for painful mycotic nails. -Mechanically debrided and reduced mycotic nails with sterile nail nipper and dremel nail file without incident. -Encouraged continued elevation of legs daily to assist with edema control -Patient to return in 2.5 months for follow up evaluation or sooner if symptoms worsen.  Landis Martins, DPM

## 2017-01-12 DIAGNOSIS — H6123 Impacted cerumen, bilateral: Secondary | ICD-10-CM | POA: Diagnosis not present

## 2017-01-12 DIAGNOSIS — Z6828 Body mass index (BMI) 28.0-28.9, adult: Secondary | ICD-10-CM | POA: Diagnosis not present

## 2017-01-12 DIAGNOSIS — Z125 Encounter for screening for malignant neoplasm of prostate: Secondary | ICD-10-CM | POA: Diagnosis not present

## 2017-01-12 DIAGNOSIS — R7301 Impaired fasting glucose: Secondary | ICD-10-CM | POA: Diagnosis not present

## 2017-01-12 DIAGNOSIS — F17200 Nicotine dependence, unspecified, uncomplicated: Secondary | ICD-10-CM | POA: Diagnosis not present

## 2017-01-12 DIAGNOSIS — K219 Gastro-esophageal reflux disease without esophagitis: Secondary | ICD-10-CM | POA: Diagnosis not present

## 2017-01-12 DIAGNOSIS — Z720 Tobacco use: Secondary | ICD-10-CM | POA: Diagnosis not present

## 2017-01-12 DIAGNOSIS — E785 Hyperlipidemia, unspecified: Secondary | ICD-10-CM | POA: Diagnosis not present

## 2017-01-12 DIAGNOSIS — D519 Vitamin B12 deficiency anemia, unspecified: Secondary | ICD-10-CM | POA: Diagnosis not present

## 2017-01-12 DIAGNOSIS — J302 Other seasonal allergic rhinitis: Secondary | ICD-10-CM | POA: Diagnosis not present

## 2017-01-12 DIAGNOSIS — I480 Paroxysmal atrial fibrillation: Secondary | ICD-10-CM | POA: Diagnosis not present

## 2017-03-04 DIAGNOSIS — H401133 Primary open-angle glaucoma, bilateral, severe stage: Secondary | ICD-10-CM | POA: Diagnosis not present

## 2017-03-13 ENCOUNTER — Ambulatory Visit: Payer: Medicare Other | Admitting: Sports Medicine

## 2017-03-18 ENCOUNTER — Ambulatory Visit (INDEPENDENT_AMBULATORY_CARE_PROVIDER_SITE_OTHER): Payer: Medicare Other | Admitting: Sports Medicine

## 2017-03-18 ENCOUNTER — Encounter: Payer: Self-pay | Admitting: Sports Medicine

## 2017-03-18 DIAGNOSIS — M79676 Pain in unspecified toe(s): Secondary | ICD-10-CM

## 2017-03-18 DIAGNOSIS — B351 Tinea unguium: Secondary | ICD-10-CM | POA: Diagnosis not present

## 2017-03-18 DIAGNOSIS — I739 Peripheral vascular disease, unspecified: Secondary | ICD-10-CM | POA: Diagnosis not present

## 2017-03-18 NOTE — Progress Notes (Signed)
   Subjective:    Patient ID: Lucas Miller, male    DOB: 1935-02-07, 82 y.o.   MRN: [id number]  HPI    Review of Systems  All other systems reviewed and are negative.      Objective:   Physical Exam        Assessment & Plan:

## 2017-03-18 NOTE — Progress Notes (Signed)
Subjective: Tadeo Heringer is a 82 y.o. male patient seen today in office with complaint of painful thickened and elongated toenails; unable to trim. Patient denies any changes since last visit with medical history. Patient has no other pedal complaints at this time.    There are no active problems to display for this patient.   Current Outpatient Prescriptions on File Prior to Visit  Medication Sig Dispense Refill  . aspirin 81 MG tablet Take 81 mg by mouth daily.    . COMBIGAN 0.2-0.5 % ophthalmic solution PUT 1 DROP INTO INTO BOTH EYES TWICE A DAY  5  . dorzolamide (TRUSOPT) 2 % ophthalmic solution     . doxycycline (VIBRAMYCIN) 100 MG capsule     . folic acid (FOLVITE) 1 MG tablet   1  . FOLPLEX 2.2 2.2-25-0.5 MG TABS Take 1 tablet by mouth daily.  12  . LUMIGAN 0.01 % SOLN PLACE 1 DROP IN EACH AFFECTED EYE(S) DAILY IN THE EVENING  12  . omeprazole (PRILOSEC) 20 MG capsule Take 20 mg by mouth 2 (two) times daily.  12  . simvastatin (ZOCOR) 40 MG tablet Take 40 mg by mouth at bedtime.  5  . triamcinolone cream (KENALOG) 0.1 %      No current facility-administered medications on file prior to visit.     Allergies  Allergen Reactions  . Codeine   . Sulfa Antibiotics     Objective: Physical Exam  General: Well developed, nourished, no acute distress, awake, alert and oriented x 3  Vascular: Dorsalis pedis artery 1 out of 4 bilateral, Posterior tibial artery 0 out of 4 bilateral, skin temperature warm to warm proximal to distal bilateral lower extremities, there is varicosities, scant pedal hair present bilateral. Trace edema noted to both legs bilateral.  Neurological: Gross sensation present via light touch bilateral.   Dermatological: Skin is warm, dry, and supple bilateral, Nails 1-10 are tender, long, thick, and discolored with mild subungal debris, no webspace macerations present bilateral, no open lesions present bilateral, no callus/corns/hyperkeratotic tissue present  bilateral. No signs of infection bilateral.  Musculoskeletal: No symptomatic boney deformities noted bilateral. Muscular strength within normal limits without painon range of motion. No pain with calf compression bilateral.  Assessment and Plan:  Problem List Items Addressed This Visit    None    Visit Diagnoses    Pain due to onychomycosis of toenail    -  Primary   PVD (peripheral vascular disease) ([place])         -Examined patient.  -Discussed treatment options for painful mycotic nails. -Mechanically debrided and reduced mycotic nails with sterile nail nipper and dremel nail file without incident. -ABN signed  -Encouraged continued elevation of legs daily to assist with edema control -Patient to return in 2.5 months for follow up evaluation or sooner if symptoms worsen.  Landis Martins, DPM

## 2017-03-23 DIAGNOSIS — H53461 Homonymous bilateral field defects, right side: Secondary | ICD-10-CM | POA: Diagnosis not present

## 2017-03-23 DIAGNOSIS — H401123 Primary open-angle glaucoma, left eye, severe stage: Secondary | ICD-10-CM | POA: Diagnosis not present

## 2017-03-23 DIAGNOSIS — H401113 Primary open-angle glaucoma, right eye, severe stage: Secondary | ICD-10-CM | POA: Diagnosis not present

## 2017-04-06 DIAGNOSIS — H401113 Primary open-angle glaucoma, right eye, severe stage: Secondary | ICD-10-CM | POA: Diagnosis not present

## 2017-04-24 DIAGNOSIS — Z6827 Body mass index (BMI) 27.0-27.9, adult: Secondary | ICD-10-CM | POA: Diagnosis not present

## 2017-04-24 DIAGNOSIS — R7301 Impaired fasting glucose: Secondary | ICD-10-CM | POA: Diagnosis not present

## 2017-04-24 DIAGNOSIS — K219 Gastro-esophageal reflux disease without esophagitis: Secondary | ICD-10-CM | POA: Diagnosis not present

## 2017-04-24 DIAGNOSIS — I480 Paroxysmal atrial fibrillation: Secondary | ICD-10-CM | POA: Diagnosis not present

## 2017-04-24 DIAGNOSIS — E785 Hyperlipidemia, unspecified: Secondary | ICD-10-CM | POA: Diagnosis not present

## 2017-04-24 DIAGNOSIS — D519 Vitamin B12 deficiency anemia, unspecified: Secondary | ICD-10-CM | POA: Diagnosis not present

## 2017-05-04 DIAGNOSIS — Z87891 Personal history of nicotine dependence: Secondary | ICD-10-CM | POA: Diagnosis not present

## 2017-05-04 DIAGNOSIS — J040 Acute laryngitis: Secondary | ICD-10-CM | POA: Diagnosis not present

## 2017-05-04 DIAGNOSIS — F17200 Nicotine dependence, unspecified, uncomplicated: Secondary | ICD-10-CM | POA: Diagnosis not present

## 2017-05-29 DIAGNOSIS — J181 Lobar pneumonia, unspecified organism: Secondary | ICD-10-CM | POA: Diagnosis not present

## 2017-05-29 DIAGNOSIS — R1031 Right lower quadrant pain: Secondary | ICD-10-CM | POA: Diagnosis not present

## 2017-05-29 DIAGNOSIS — J90 Pleural effusion, not elsewhere classified: Secondary | ICD-10-CM | POA: Diagnosis not present

## 2017-06-03 ENCOUNTER — Ambulatory Visit: Payer: Medicare Other | Admitting: Sports Medicine

## 2017-06-11 DIAGNOSIS — J181 Lobar pneumonia, unspecified organism: Secondary | ICD-10-CM | POA: Diagnosis not present

## 2017-06-11 DIAGNOSIS — R918 Other nonspecific abnormal finding of lung field: Secondary | ICD-10-CM | POA: Diagnosis not present

## 2017-06-11 DIAGNOSIS — Z720 Tobacco use: Secondary | ICD-10-CM | POA: Diagnosis not present

## 2017-06-11 DIAGNOSIS — J019 Acute sinusitis, unspecified: Secondary | ICD-10-CM | POA: Diagnosis not present

## 2017-06-11 DIAGNOSIS — Z79899 Other long term (current) drug therapy: Secondary | ICD-10-CM | POA: Diagnosis not present

## 2017-07-08 DIAGNOSIS — C3432 Malignant neoplasm of lower lobe, left bronchus or lung: Secondary | ICD-10-CM | POA: Diagnosis not present

## 2017-07-08 DIAGNOSIS — R918 Other nonspecific abnormal finding of lung field: Secondary | ICD-10-CM | POA: Diagnosis not present

## 2017-07-27 DIAGNOSIS — K219 Gastro-esophageal reflux disease without esophagitis: Secondary | ICD-10-CM | POA: Diagnosis not present

## 2017-07-27 DIAGNOSIS — I480 Paroxysmal atrial fibrillation: Secondary | ICD-10-CM | POA: Diagnosis not present

## 2017-07-27 DIAGNOSIS — D519 Vitamin B12 deficiency anemia, unspecified: Secondary | ICD-10-CM | POA: Diagnosis not present

## 2017-07-27 DIAGNOSIS — E785 Hyperlipidemia, unspecified: Secondary | ICD-10-CM | POA: Diagnosis not present

## 2017-07-27 DIAGNOSIS — R911 Solitary pulmonary nodule: Secondary | ICD-10-CM | POA: Diagnosis not present

## 2017-07-27 DIAGNOSIS — Z6827 Body mass index (BMI) 27.0-27.9, adult: Secondary | ICD-10-CM | POA: Diagnosis not present

## 2017-07-27 DIAGNOSIS — R7301 Impaired fasting glucose: Secondary | ICD-10-CM | POA: Diagnosis not present

## 2017-07-29 ENCOUNTER — Encounter: Payer: Self-pay | Admitting: Sports Medicine

## 2017-07-29 ENCOUNTER — Ambulatory Visit (INDEPENDENT_AMBULATORY_CARE_PROVIDER_SITE_OTHER): Payer: Medicare Other | Admitting: Sports Medicine

## 2017-07-29 DIAGNOSIS — I739 Peripheral vascular disease, unspecified: Secondary | ICD-10-CM

## 2017-07-29 DIAGNOSIS — B351 Tinea unguium: Secondary | ICD-10-CM | POA: Diagnosis not present

## 2017-07-29 DIAGNOSIS — M79676 Pain in unspecified toe(s): Secondary | ICD-10-CM

## 2017-07-29 NOTE — Progress Notes (Signed)
Subjective: Lucas Miller is a 83 y.o. male patient seen today in office with complaint of painful thickened and elongated toenails; unable to trim. Patient denies any changes since last visit with medical history or any new problems since last visit. Patient has no other pedal complaints at this time.    There are no active problems to display for this patient.   Current Outpatient Medications on File Prior to Visit  Medication Sig Dispense Refill  . aspirin 81 MG tablet Take 81 mg by mouth daily.    . COMBIGAN 0.2-0.5 % ophthalmic solution PUT 1 DROP INTO INTO BOTH EYES TWICE A DAY  5  . dorzolamide (TRUSOPT) 2 % ophthalmic solution     . doxycycline (VIBRAMYCIN) 100 MG capsule     . folic acid (FOLVITE) 1 MG tablet   1  . FOLPLEX 2.2 2.2-25-0.5 MG TABS Take 1 tablet by mouth daily.  12  . LUMIGAN 0.01 % SOLN PLACE 1 DROP IN EACH AFFECTED EYE(S) DAILY IN THE EVENING  12  . omeprazole (PRILOSEC) 20 MG capsule Take 20 mg by mouth 2 (two) times daily.  12  . simvastatin (ZOCOR) 40 MG tablet Take 40 mg by mouth at bedtime.  5  . triamcinolone cream (KENALOG) 0.1 %      No current facility-administered medications on file prior to visit.     Allergies  Allergen Reactions  . Codeine   . Sulfa Antibiotics     Objective: Physical Exam  General: Well developed, nourished, no acute distress, awake, alert and oriented x 3  Vascular: Dorsalis pedis artery 1 out of 4 bilateral, Posterior tibial artery 0 out of 4 bilateral, skin temperature warm to warm proximal to distal bilateral lower extremities, there is varicosities, scant pedal hair present bilateral. Trace edema noted to both legs bilateral.  Neurological: Gross sensation present via light touch bilateral.   Dermatological: Skin is warm, dry, and supple bilateral, Nails 1-10 are tender, long, thick, and discolored with mild subungal debris, no webspace macerations present bilateral, no open lesions present bilateral, no  callus/corns/hyperkeratotic tissue present bilateral. No signs of infection bilateral.  Musculoskeletal: No symptomatic boney deformities noted bilateral. Muscular strength within normal limits without painon range of motion. No pain with calf compression bilateral.  Assessment and Plan:  Problem List Items Addressed This Visit    None    Visit Diagnoses    Pain due to onychomycosis of toenail    -  Primary   PVD (peripheral vascular disease) ([place])         -Examined patient.  -Discussed treatment options for painful mycotic nails. -Mechanically debrided and reduced mycotic nails with sterile nail nipper and dremel nail file without incident. -Advised patient to dry and clean well in between toes -Encouraged continued elevation of legs daily to assist with edema control -Patient to return in 2.5 months for follow up evaluation or sooner if symptoms worsen.  Landis Martins, DPM

## 2017-08-19 DIAGNOSIS — H401133 Primary open-angle glaucoma, bilateral, severe stage: Secondary | ICD-10-CM | POA: Diagnosis not present

## 2017-08-21 DIAGNOSIS — H6692 Otitis media, unspecified, left ear: Secondary | ICD-10-CM | POA: Diagnosis not present

## 2017-09-01 DIAGNOSIS — H6692 Otitis media, unspecified, left ear: Secondary | ICD-10-CM | POA: Diagnosis not present

## 2017-09-01 DIAGNOSIS — H6122 Impacted cerumen, left ear: Secondary | ICD-10-CM | POA: Diagnosis not present

## 2017-10-07 ENCOUNTER — Ambulatory Visit: Payer: Medicare Other | Admitting: Sports Medicine

## 2017-10-23 ENCOUNTER — Encounter: Payer: Self-pay | Admitting: Sports Medicine

## 2017-10-23 ENCOUNTER — Ambulatory Visit (INDEPENDENT_AMBULATORY_CARE_PROVIDER_SITE_OTHER): Payer: Medicare Other | Admitting: Sports Medicine

## 2017-10-23 DIAGNOSIS — B351 Tinea unguium: Secondary | ICD-10-CM

## 2017-10-23 DIAGNOSIS — I739 Peripheral vascular disease, unspecified: Secondary | ICD-10-CM

## 2017-10-23 DIAGNOSIS — M79676 Pain in unspecified toe(s): Secondary | ICD-10-CM | POA: Diagnosis not present

## 2017-10-23 NOTE — Progress Notes (Signed)
Subjective: Lucas Miller is a 83 y.o. male patient seen today in office with complaint of painful thickened and elongated toenails; unable to trim. Patient denies any changes since last visit with medical history or any new problems since last visit. Patient has no other pedal complaints at this time.    There are no active problems to display for this patient.   Current Outpatient Medications on File Prior to Visit  Medication Sig Dispense Refill  . aspirin 81 MG tablet Take 81 mg by mouth daily.    . COMBIGAN 0.2-0.5 % ophthalmic solution PUT 1 DROP INTO INTO BOTH EYES TWICE A DAY  5  . dorzolamide (TRUSOPT) 2 % ophthalmic solution     . doxycycline (VIBRAMYCIN) 100 MG capsule     . folic acid (FOLVITE) 1 MG tablet   1  . FOLPLEX 2.2 2.2-25-0.5 MG TABS Take 1 tablet by mouth daily.  12  . LUMIGAN 0.01 % SOLN PLACE 1 DROP IN EACH AFFECTED EYE(S) DAILY IN THE EVENING  12  . omeprazole (PRILOSEC) 20 MG capsule Take 20 mg by mouth 2 (two) times daily.  12  . simvastatin (ZOCOR) 40 MG tablet Take 40 mg by mouth at bedtime.  5  . triamcinolone cream (KENALOG) 0.1 %      No current facility-administered medications on file prior to visit.     Allergies  Allergen Reactions  . Codeine   . Sulfa Antibiotics     Objective: Physical Exam  General: Well developed, nourished, no acute distress, awake, alert and oriented x 3  Vascular: Dorsalis pedis artery 1 out of 4 bilateral, Posterior tibial artery 0 out of 4 bilateral, skin temperature warm to warm proximal to distal bilateral lower extremities, there is varicosities, scant pedal hair present bilateral. Trace edema noted to both legs bilateral.  Neurological: Gross sensation present via light touch bilateral.   Dermatological: Skin is warm, dry, and supple bilateral, Nails 1-10 are tender, long, thick, and discolored with mild subungal debris, no webspace macerations present bilateral, no open lesions present bilateral, no  callus/corns/hyperkeratotic tissue present bilateral. No signs of infection bilateral.  Musculoskeletal: No symptomatic boney deformities noted bilateral. Muscular strength within normal limits without painon range of motion. No pain with calf compression bilateral.  Assessment and Plan:  Problem List Items Addressed This Visit    None    Visit Diagnoses    Pain due to onychomycosis of toenail    -  Primary   PVD (peripheral vascular disease) ([place])         -Examined patient.  -ABN signed -Discussed treatment options for painful mycotic nails. -Mechanically debrided and reduced mycotic nails with sterile nail nipper and dremel nail file without incident. -Encouraged continued elevation of legs daily to assist with edema control -Patient to return in 3 to 2.5 months for follow up evaluation or sooner if symptoms worsen.  Landis Martins, DPM

## 2017-11-04 DIAGNOSIS — Z6827 Body mass index (BMI) 27.0-27.9, adult: Secondary | ICD-10-CM | POA: Diagnosis not present

## 2017-11-04 DIAGNOSIS — R7301 Impaired fasting glucose: Secondary | ICD-10-CM | POA: Diagnosis not present

## 2017-11-04 DIAGNOSIS — K219 Gastro-esophageal reflux disease without esophagitis: Secondary | ICD-10-CM | POA: Diagnosis not present

## 2017-11-04 DIAGNOSIS — I480 Paroxysmal atrial fibrillation: Secondary | ICD-10-CM | POA: Diagnosis not present

## 2017-11-04 DIAGNOSIS — D519 Vitamin B12 deficiency anemia, unspecified: Secondary | ICD-10-CM | POA: Diagnosis not present

## 2017-11-04 DIAGNOSIS — E785 Hyperlipidemia, unspecified: Secondary | ICD-10-CM | POA: Diagnosis not present

## 2017-11-18 DIAGNOSIS — H401133 Primary open-angle glaucoma, bilateral, severe stage: Secondary | ICD-10-CM | POA: Diagnosis not present

## 2017-11-26 DIAGNOSIS — Z0000 Encounter for general adult medical examination without abnormal findings: Secondary | ICD-10-CM | POA: Diagnosis not present

## 2017-11-26 DIAGNOSIS — Z9181 History of falling: Secondary | ICD-10-CM | POA: Diagnosis not present

## 2017-11-26 DIAGNOSIS — E785 Hyperlipidemia, unspecified: Secondary | ICD-10-CM | POA: Diagnosis not present

## 2017-11-26 DIAGNOSIS — Z1331 Encounter for screening for depression: Secondary | ICD-10-CM | POA: Diagnosis not present

## 2017-11-26 DIAGNOSIS — Z136 Encounter for screening for cardiovascular disorders: Secondary | ICD-10-CM | POA: Diagnosis not present

## 2017-11-26 DIAGNOSIS — Z125 Encounter for screening for malignant neoplasm of prostate: Secondary | ICD-10-CM | POA: Diagnosis not present

## 2017-12-17 DIAGNOSIS — C44622 Squamous cell carcinoma of skin of right upper limb, including shoulder: Secondary | ICD-10-CM | POA: Diagnosis not present

## 2017-12-17 DIAGNOSIS — L720 Epidermal cyst: Secondary | ICD-10-CM | POA: Diagnosis not present

## 2018-01-07 DIAGNOSIS — C44622 Squamous cell carcinoma of skin of right upper limb, including shoulder: Secondary | ICD-10-CM | POA: Diagnosis not present

## 2018-01-26 DIAGNOSIS — L720 Epidermal cyst: Secondary | ICD-10-CM | POA: Diagnosis not present

## 2018-01-29 ENCOUNTER — Encounter: Payer: Self-pay | Admitting: Sports Medicine

## 2018-01-29 ENCOUNTER — Ambulatory Visit (INDEPENDENT_AMBULATORY_CARE_PROVIDER_SITE_OTHER): Payer: Medicare Other | Admitting: Sports Medicine

## 2018-01-29 DIAGNOSIS — M79676 Pain in unspecified toe(s): Secondary | ICD-10-CM | POA: Diagnosis not present

## 2018-01-29 DIAGNOSIS — B351 Tinea unguium: Secondary | ICD-10-CM | POA: Diagnosis not present

## 2018-01-29 DIAGNOSIS — I739 Peripheral vascular disease, unspecified: Secondary | ICD-10-CM

## 2018-01-29 NOTE — Progress Notes (Signed)
Subjective: Lucas Miller is a 83 y.o. male patient seen today in office with complaint of painful thickened and elongated toenails; unable to trim. Patient denies any changes since last visit with medical history or any new problems since last visit. Patient has no other pedal complaints at this time.    There are no active problems to display for this patient.   Current Outpatient Medications on File Prior to Visit  Medication Sig Dispense Refill  . aspirin 81 MG tablet Take 81 mg by mouth daily.    . COMBIGAN 0.2-0.5 % ophthalmic solution PUT 1 DROP INTO INTO BOTH EYES TWICE A DAY  5  . dorzolamide (TRUSOPT) 2 % ophthalmic solution     . doxycycline (VIBRAMYCIN) 100 MG capsule     . folic acid (FOLVITE) 1 MG tablet   1  . FOLPLEX 2.2 2.2-25-0.5 MG TABS Take 1 tablet by mouth daily.  12  . LUMIGAN 0.01 % SOLN PLACE 1 DROP IN EACH AFFECTED EYE(S) DAILY IN THE EVENING  12  . omeprazole (PRILOSEC) 20 MG capsule Take 20 mg by mouth 2 (two) times daily.  12  . simvastatin (ZOCOR) 40 MG tablet Take 40 mg by mouth at bedtime.  5  . triamcinolone cream (KENALOG) 0.1 %      No current facility-administered medications on file prior to visit.     Allergies  Allergen Reactions  . Codeine   . Sulfa Antibiotics     Objective: Physical Exam  General: Well developed, nourished, no acute distress, awake, alert and oriented x 3  Vascular: Dorsalis pedis artery 1 out of 4 bilateral, Posterior tibial artery 0 out of 4 bilateral, skin temperature warm to warm proximal to distal bilateral lower extremities, there is varicosities, scant pedal hair present bilateral. Trace edema noted to both legs bilateral.  Neurological: Gross sensation present via light touch bilateral.   Dermatological: Skin is warm, dry, and supple bilateral, Nails 1-10 are tender, long, thick, and discolored with mild subungal debris, no webspace macerations present bilateral, no open lesions present bilateral, no  callus/corns/hyperkeratotic tissue present bilateral. No signs of infection bilateral.  Musculoskeletal: No symptomatic boney deformities noted bilateral. Muscular strength within normal limits without painon range of motion. No pain with calf compression bilateral.  Assessment and Plan:  Problem List Items Addressed This Visit    None    Visit Diagnoses    Pain due to onychomycosis of toenail    -  Primary   PVD (peripheral vascular disease) ([place])         -Examined patient.  -ABN signed last visit -Discussed treatment options for painful mycotic nails. -Mechanically debrided and reduced mycotic nails with sterile nail nipper and dremel nail file without incident. -Encouraged continued elevation of legs daily to assist with edema control -Patient to return in 3 to 2.5 months for follow up evaluation or sooner if symptoms worsen.  Landis Martins, DPM

## 2018-02-04 DIAGNOSIS — R7301 Impaired fasting glucose: Secondary | ICD-10-CM | POA: Diagnosis not present

## 2018-02-04 DIAGNOSIS — D519 Vitamin B12 deficiency anemia, unspecified: Secondary | ICD-10-CM | POA: Diagnosis not present

## 2018-02-04 DIAGNOSIS — E785 Hyperlipidemia, unspecified: Secondary | ICD-10-CM | POA: Diagnosis not present

## 2018-02-04 DIAGNOSIS — Z125 Encounter for screening for malignant neoplasm of prostate: Secondary | ICD-10-CM | POA: Diagnosis not present

## 2018-02-04 DIAGNOSIS — I480 Paroxysmal atrial fibrillation: Secondary | ICD-10-CM | POA: Diagnosis not present

## 2018-03-10 DIAGNOSIS — H401133 Primary open-angle glaucoma, bilateral, severe stage: Secondary | ICD-10-CM | POA: Diagnosis not present

## 2018-05-03 DIAGNOSIS — C44622 Squamous cell carcinoma of skin of right upper limb, including shoulder: Secondary | ICD-10-CM | POA: Diagnosis not present

## 2018-05-03 DIAGNOSIS — L720 Epidermal cyst: Secondary | ICD-10-CM | POA: Diagnosis not present

## 2018-05-05 ENCOUNTER — Ambulatory Visit (INDEPENDENT_AMBULATORY_CARE_PROVIDER_SITE_OTHER): Payer: Medicare Other | Admitting: Sports Medicine

## 2018-05-05 ENCOUNTER — Encounter: Payer: Self-pay | Admitting: Sports Medicine

## 2018-05-05 VITALS — BP 130/80 | HR 85

## 2018-05-05 DIAGNOSIS — M79676 Pain in unspecified toe(s): Secondary | ICD-10-CM | POA: Diagnosis not present

## 2018-05-05 DIAGNOSIS — B351 Tinea unguium: Secondary | ICD-10-CM | POA: Diagnosis not present

## 2018-05-05 DIAGNOSIS — I739 Peripheral vascular disease, unspecified: Secondary | ICD-10-CM

## 2018-05-05 NOTE — Progress Notes (Signed)
Subjective: Lucas Miller is a 83 y.o. male patient seen today in office with complaint of painful thickened and elongated toenails; unable to trim. Patient denies any changes since last visit with medical history or any new problems since last visit. Patient has no other pedal complaints at this time.    There are no active problems to display for this patient.   Current Outpatient Medications on File Prior to Visit  Medication Sig Dispense Refill  . aspirin 81 MG tablet Take 81 mg by mouth daily.    . COMBIGAN 0.2-0.5 % ophthalmic solution PUT 1 DROP INTO INTO BOTH EYES TWICE A DAY  5  . dorzolamide (TRUSOPT) 2 % ophthalmic solution     . doxycycline (VIBRAMYCIN) 100 MG capsule     . folic acid (FOLVITE) 1 MG tablet   1  . FOLPLEX 2.2 2.2-25-0.5 MG TABS Take 1 tablet by mouth daily.  12  . LUMIGAN 0.01 % SOLN PLACE 1 DROP IN EACH AFFECTED EYE(S) DAILY IN THE EVENING  12  . omeprazole (PRILOSEC) 20 MG capsule Take 20 mg by mouth 2 (two) times daily.  12  . simvastatin (ZOCOR) 40 MG tablet Take 40 mg by mouth at bedtime.  5  . triamcinolone cream (KENALOG) 0.1 %      No current facility-administered medications on file prior to visit.     Allergies  Allergen Reactions  . Codeine   . Sulfa Antibiotics     Objective: Physical Exam  General: Well developed, nourished, no acute distress, awake, alert and oriented x 3  Vascular: Dorsalis pedis artery 1 out of 4 bilateral, Posterior tibial artery 0 out of 4 bilateral, skin temperature warm to warm proximal to distal bilateral lower extremities, there is varicosities, scant pedal hair present bilateral. Trace edema noted to both legs bilateral.  Neurological: Gross sensation present via light touch bilateral.   Dermatological: Skin is warm, dry, and supple bilateral, Nails 1-10 are tender, long, thick, and discolored with mild subungal debris, no webspace macerations present bilateral, no open lesions present bilateral, no  callus/corns/hyperkeratotic tissue present bilateral. No signs of infection bilateral.  Musculoskeletal: No symptomatic boney deformities noted bilateral. Muscular strength within normal limits without painon range of motion. No pain with calf compression bilateral.  Assessment and Plan:  Problem List Items Addressed This Visit    None    Visit Diagnoses    Pain due to onychomycosis of toenail    -  Primary   PVD (peripheral vascular disease) ([place])         -Examined patient.  -ABN signed  -Discussed treatment options for painful mycotic nails. -Mechanically debrided and reduced mycotic nails with sterile nail nipper and dremel nail file without incident. -Encouraged continued elevation of legs daily to assist with edema control -Patient to return in 3 to 2.5 months for follow up evaluation or sooner if symptoms worsen.  Landis Martins, DPM

## 2018-05-13 DIAGNOSIS — D519 Vitamin B12 deficiency anemia, unspecified: Secondary | ICD-10-CM | POA: Diagnosis not present

## 2018-05-13 DIAGNOSIS — E785 Hyperlipidemia, unspecified: Secondary | ICD-10-CM | POA: Diagnosis not present

## 2018-05-13 DIAGNOSIS — H6123 Impacted cerumen, bilateral: Secondary | ICD-10-CM | POA: Diagnosis not present

## 2018-05-13 DIAGNOSIS — I480 Paroxysmal atrial fibrillation: Secondary | ICD-10-CM | POA: Diagnosis not present

## 2018-05-13 DIAGNOSIS — R7301 Impaired fasting glucose: Secondary | ICD-10-CM | POA: Diagnosis not present

## 2018-06-10 DIAGNOSIS — R6889 Other general symptoms and signs: Secondary | ICD-10-CM | POA: Diagnosis not present

## 2018-06-30 DIAGNOSIS — J019 Acute sinusitis, unspecified: Secondary | ICD-10-CM | POA: Diagnosis not present

## 2018-07-16 ENCOUNTER — Encounter: Payer: Self-pay | Admitting: Sports Medicine

## 2018-07-16 ENCOUNTER — Ambulatory Visit (INDEPENDENT_AMBULATORY_CARE_PROVIDER_SITE_OTHER): Payer: Medicare Other | Admitting: Sports Medicine

## 2018-07-16 VITALS — BP 119/74 | HR 79 | Resp 16

## 2018-07-16 DIAGNOSIS — M79676 Pain in unspecified toe(s): Secondary | ICD-10-CM | POA: Diagnosis not present

## 2018-07-16 DIAGNOSIS — B351 Tinea unguium: Secondary | ICD-10-CM

## 2018-07-16 DIAGNOSIS — I739 Peripheral vascular disease, unspecified: Secondary | ICD-10-CM

## 2018-07-16 NOTE — Progress Notes (Signed)
  Subjective: Kasim Molter is a 84 y.o. male patient seen today in office with complaint of painful thickened and elongated toenails; unable to trim. Patient denies any changes since last visit with medical history or any new problems since last visit. Patient has no other pedal complaints at this time.    There are no active problems to display for this patient.   Current Outpatient Medications on File Prior to Visit  Medication Sig Dispense Refill  . aspirin 81 MG tablet Take 81 mg by mouth daily.    . COMBIGAN 0.2-0.5 % ophthalmic solution PUT 1 DROP INTO INTO BOTH EYES TWICE A DAY  5  . dorzolamide (TRUSOPT) 2 % ophthalmic solution     . doxycycline (VIBRAMYCIN) 100 MG capsule     . folic acid (FOLVITE) 1 MG tablet   1  . FOLPLEX 2.2 2.2-25-0.5 MG TABS Take 1 tablet by mouth daily.  12  . LUMIGAN 0.01 % SOLN PLACE 1 DROP IN EACH AFFECTED EYE(S) DAILY IN THE EVENING  12  . omeprazole (PRILOSEC) 20 MG capsule Take 20 mg by mouth 2 (two) times daily.  12  . simvastatin (ZOCOR) 40 MG tablet Take 40 mg by mouth at bedtime.  5  . triamcinolone cream (KENALOG) 0.1 %      No current facility-administered medications on file prior to visit.     Allergies  Allergen Reactions  . Codeine   . Sulfa Antibiotics     Objective: Physical Exam  General: Well developed, nourished, no acute distress, awake, alert and oriented x 3  Vascular: Dorsalis pedis artery 1 out of 4 bilateral, Posterior tibial artery 0 out of 4 bilateral, skin temperature warm to warm proximal to distal bilateral lower extremities, there is varicosities, scant pedal hair present bilateral. Trace edema noted to both legs bilateral.  Neurological: Gross sensation present via light touch bilateral.   Dermatological: Skin is warm, dry, and supple bilateral, Nails 1-10 are tender, long, thick, and discolored with mild subungal debris, no webspace macerations present bilateral, no open lesions present bilateral, no  callus/corns/hyperkeratotic tissue present bilateral. No signs of infection bilateral.  Musculoskeletal: No symptomatic boney deformities noted bilateral. Muscular strength within normal limits without painon range of motion. No pain with calf compression bilateral.  Assessment and Plan:  Problem List Items Addressed This Visit    None    Visit Diagnoses    Pain due to onychomycosis of toenail    -  Primary   PVD (peripheral vascular disease) ([place])         -Examined patient.  -ABN on file -Discussed treatment options for painful mycotic nails. -Mechanically debrided and reduced mycotic nails with sterile nail nipper and dremel nail file without incident. -Encouraged daily skin emollients -Patient to return in 2.5 months for follow up evaluation or sooner if symptoms worsen.  Landis Martins, DPM

## 2018-08-10 DIAGNOSIS — H401133 Primary open-angle glaucoma, bilateral, severe stage: Secondary | ICD-10-CM | POA: Diagnosis not present

## 2018-08-14 ENCOUNTER — Emergency Department (HOSPITAL_COMMUNITY)
Admission: EM | Admit: 2018-08-14 | Discharge: 2018-09-01 | Disposition: E | Payer: Medicare Other | Attending: Emergency Medicine | Admitting: Emergency Medicine

## 2018-08-14 ENCOUNTER — Emergency Department (HOSPITAL_COMMUNITY): Payer: Medicare Other

## 2018-08-14 ENCOUNTER — Encounter (HOSPITAL_COMMUNITY): Payer: Self-pay | Admitting: Emergency Medicine

## 2018-08-14 DIAGNOSIS — I499 Cardiac arrhythmia, unspecified: Secondary | ICD-10-CM | POA: Diagnosis not present

## 2018-08-14 DIAGNOSIS — I469 Cardiac arrest, cause unspecified: Secondary | ICD-10-CM | POA: Insufficient documentation

## 2018-08-14 DIAGNOSIS — I213 ST elevation (STEMI) myocardial infarction of unspecified site: Secondary | ICD-10-CM | POA: Diagnosis not present

## 2018-08-14 DIAGNOSIS — R0602 Shortness of breath: Secondary | ICD-10-CM | POA: Diagnosis not present

## 2018-08-14 DIAGNOSIS — Z880 Allergy status to penicillin: Secondary | ICD-10-CM | POA: Diagnosis not present

## 2018-08-14 DIAGNOSIS — Z79899 Other long term (current) drug therapy: Secondary | ICD-10-CM | POA: Insufficient documentation

## 2018-08-14 DIAGNOSIS — R918 Other nonspecific abnormal finding of lung field: Secondary | ICD-10-CM | POA: Diagnosis not present

## 2018-08-14 DIAGNOSIS — R092 Respiratory arrest: Secondary | ICD-10-CM | POA: Diagnosis not present

## 2018-08-14 DIAGNOSIS — Z7982 Long term (current) use of aspirin: Secondary | ICD-10-CM | POA: Diagnosis not present

## 2018-08-14 DIAGNOSIS — I4891 Unspecified atrial fibrillation: Secondary | ICD-10-CM | POA: Diagnosis not present

## 2018-08-14 DIAGNOSIS — I491 Atrial premature depolarization: Secondary | ICD-10-CM | POA: Diagnosis not present

## 2018-08-14 MED ORDER — SODIUM BICARBONATE 8.4 % IV SOLN
INTRAVENOUS | Status: AC | PRN
  Administered 2018-08-14 (×2): 50 meq via INTRAVENOUS

## 2018-08-14 MED ORDER — EPINEPHRINE PF 1 MG/10ML IJ SOSY
PREFILLED_SYRINGE | INTRAMUSCULAR | Status: AC | PRN
  Administered 2018-08-14 (×3): 1 mg via INTRAVENOUS

## 2018-08-14 MED ORDER — DOBUTAMINE IN D5W 4-5 MG/ML-% IV SOLN
INTRAVENOUS | Status: AC
  Administered 2018-08-14: 5 ug/kg/min
  Filled 2018-08-14: qty 250

## 2018-08-14 MED ORDER — EPINEPHRINE PF 1 MG/10ML IJ SOSY
PREFILLED_SYRINGE | INTRAMUSCULAR | Status: AC | PRN
  Administered 2018-08-14 (×2): 1 mg via INTRAVENOUS

## 2018-08-14 MED ORDER — NOREPINEPHRINE BITARTRATE 1 MG/ML IV SOLN: INTRAVENOUS | Status: AC | PRN

## 2018-08-14 MED ORDER — NOREPINEPHRINE BITARTRATE 1 MG/ML IV SOLN
INTRAVENOUS | Status: AC | PRN
  Administered 2018-08-14: 4 ug/kg/min via INTRAVENOUS

## 2018-08-14 MED ORDER — SODIUM BICARBONATE 8.4 % IV SOLN
INTRAVENOUS | Status: AC | PRN
  Administered 2018-08-14: 50 meq via INTRAVENOUS

## 2018-08-14 NOTE — Code Documentation (Signed)
PEA  Compressions resumed

## 2018-08-14 NOTE — Code Documentation (Addendum)
Patient time of death occurred at 0712 as called by Dr. Addison Lank.

## 2018-08-14 NOTE — Code Documentation (Signed)
Pulses regained

## 2018-08-14 NOTE — ED Provider Notes (Signed)
[facility] EMERGENCY DEPARTMENT Provider Note  CSN: [id number] Arrival date & time: 08/01/2018 0607  Chief Complaint(s) Cardiac Arrest  HPI Lucas Miller is a 84 y.o. male who presents by EMS after cardiac arrest.  Remainder of history, ROS, and physical exam limited due to patient's condition (unresponsive). Additional information was obtained from EMS.   Level V Caveat.  EMS was called out.  When they arrived patient had agonal breathing and noted to be in PEA.  CPR was initiated and return of spontaneous circulation was obtained.  In route patient lost pulses again requiring CPR.  Dose of epinephrine was given and CPR was resumed.  Return of spontaneous circulation was again achieved.  King airway was inserted.  Code STEMI was initiated by EMS.  Canceled by cardiology after reviewing the EKG.  HPI  Past Medical History Past Medical History:  Diagnosis Date  . Glaucoma    There are no active problems to display for this patient.  Home Medication(s) Prior to Admission medications   Medication Sig Start Date End Date Taking? Authorizing Provider  aspirin 81 MG tablet Take 81 mg by mouth daily.    [provider]  COMBIGAN 0.2-0.5 % ophthalmic solution PUT 1 DROP INTO INTO BOTH EYES TWICE A DAY 03/14/15   [provider]  dorzolamide (TRUSOPT) 2 % ophthalmic solution  06/26/16   [provider]  doxycycline (VIBRAMYCIN) 100 MG capsule  08/21/16   [provider]  folic acid (FOLVITE) 1 MG tablet  05/21/16   [provider]  FOLPLEX 2.2 2.2-25-0.5 MG TABS Take 1 tablet by mouth daily. 03/21/15   [provider]  LUMIGAN 0.01 % SOLN PLACE 1 DROP IN EACH AFFECTED EYE(S) DAILY IN THE EVENING 03/20/15   [provider]  omeprazole (PRILOSEC) 20 MG capsule Take 20 mg by mouth 2 (two) times daily. 03/21/15   [provider]  simvastatin (ZOCOR) 40 MG tablet Take 40 mg by mouth at bedtime. 03/21/15    [provider]  triamcinolone cream (KENALOG) 0.1 %  08/21/16   [provider]                                                                                                                                    Past Surgical History History reviewed. No pertinent surgical history. Family History No family history on file.  Social History Social History   Tobacco Use  . Smoking status: Never Smoker  . Smokeless tobacco: Never Used  Substance Use Topics  . Alcohol use: No    Alcohol/week: 0.0 standard drinks  . Drug use: No   Allergies Codeine; Penicillins; and Sulfa antibiotics  Review of Systems Review of Systems  Unable to perform ROS: Patient unresponsive    Physical Exam Vital Signs  I have reviewed the triage vital signs BP (!) 106/46   Pulse 80   Resp (!) 22  SpO2 93%   Physical Exam Vitals signs reviewed.  Constitutional:      Appearance: He is well-developed.  HENT:     Head: Normocephalic and atraumatic.     Nose: Nose normal.     Mouth/Throat:     Comments: King airway in place Eyes:     Comments: Nonreactive pupils measuring 4 mm  Cardiovascular:     Rate and Rhythm: Bradycardia present.  Pulmonary:     Comments: Agonal breathing Abdominal:     Hernia: A hernia is present. Hernia is present in the ventral area (soft).  Musculoskeletal: Normal range of motion.  Skin:    General: Skin is cool.  Neurological:     Mental Status: He is unresponsive.     GCS: GCS eye subscore is 1. GCS verbal subscore is 1. GCS motor subscore is 1.  Psychiatric:        Behavior: Behavior normal.     ED Results and Treatments Labs (all labs ordered are listed, but only abnormal results are displayed) Labs Reviewed - No data to display                                                                                                                       EKG  EKG Interpretation  Date/Time:    Ventricular Rate:    PR Interval:    QRS Duration:    QT Interval:    QTC Calculation:   R Axis:     Text Interpretation:        Radiology Dg Chest Portable 1 View  Result Date: 08/10/2018 CLINICAL DATA:  Tube placement, post CPR EXAM: PORTABLE CHEST 1 VIEW COMPARISON:  None. FINDINGS: Endotracheal tube terminates 5 cm above the carina. Mild right upper lobe and left lower lobe opacities, possibly reflecting mild aspiration. No frank interstitial edema. No pleural effusion pneumothorax. Heart is normal in size. Defibrillator pads overlying the left hemithorax. Degenerative changes of the visualized thoracic spine. IMPRESSION: Endotracheal tube terminates 5 cm above the carina. Mild right upper lobe and left lower lobe opacities, possibly reflecting mild aspiration. Electronically Signed   By: Julian Hy M.D.   On: 08/22/2018 06:45   Pertinent labs & imaging results that were available during my care of the patient were reviewed by me and considered in my medical decision making (see chart for details).  Medications Ordered in ED Medications  DOBUTamine (DOBUTREX) 4-5 MG/ML-% infusion (has no administration in time range)  EPINEPHrine (ADRENALIN) 1 MG/10ML injection (1 mg Intravenous Given 08/24/2018 0624)  norepinephrine (LEVOPHED) injection ( Intravenous Rate/Dose Change 08/09/2018 0636)  sodium bicarbonate injection (50 mEq Intravenous Given 08/08/2018 0638)  EPINEPHrine (ADRENALIN) 1 MG/10ML injection (1 mg Intravenous Given 08/24/2018 0648)  EPINEPHrine (ADRENALIN) 1 MG/10ML injection (1 mg Intravenous Given 08/11/2018 0657)  sodium bicarbonate injection (50 mEq Intravenous Given 08/08/2018 0653)  norepinephrine (LEVOPHED) injection ( Intravenous Rate/Dose Change 08/12/2018 0704)  Procedures Procedure Name: Intubation Date/Time: 08/04/2018 7:30 AM Performed by: Fatima Blank, MD Pre-anesthesia Checklist: Patient  identified, Patient being monitored, Emergency Drugs available, Timeout performed and Suction available Oxygen Delivery Method: Ambu bag Preoxygenation: Pre-oxygenation with 100% oxygen Ventilation: Mask ventilation without difficulty Laryngoscope Size: Glidescope Tube size: 7.5 mm Airway Equipment and Method: Video-laryngoscopy Placement Confirmation: ETT inserted through vocal cords under direct vision,  CO2 detector and Breath sounds checked- equal and bilateral Secured at: 24 cm Tube secured with: ETT holder Difficulty Due To: Difficulty was anticipated    .Critical Care Performed by: Fatima Blank, MD Authorized by: Fatima Blank, MD    CRITICAL CARE Performed by: Grayce Sessions Cardama Total critical care time: 30 minutes Critical care time was exclusive of separately billable procedures and treating other patients. Critical care was necessary to treat or prevent imminent or life-threatening deterioration. Critical care was time spent personally by me on the following activities: development of treatment plan with patient and/or surrogate as well as nursing, discussions with consultants, evaluation of patient's response to treatment, examination of patient, obtaining history from patient or surrogate, ordering and performing treatments and interventions, ordering and review of laboratory studies, ordering and review of radiographic studies, pulse oximetry and re-evaluation of patient's condition.  Cardiopulmonary Resuscitation (CPR) Procedure Note Directed/Performed by: Fatima Blank I personally directed ancillary staff and/or performed CPR in an effort to regain return of spontaneous circulation and to maintain cardiac, neuro and systemic perfusion.     (including critical care time)  Medical Decision Making / ED Course I have reviewed the nursing notes for this encounter and the patient's prior records (if available in EHR or on provided  paperwork).    Cardiac arrest.  On arrival patient was bradycardic.  Poor cardiac activity and contractility on bedside echo.  Patient given additional doses of epinephrine.  Shortly thereafter patient lost pulses again.  ACLS was resumed.  He was given additional doses of epinephrine and bicarb.  Airway was secured with ET tube.  Patient was placed on Levophed and dobutamine.  Cardiology was at bedside and felt patient not warrant any catheterization at this time.  Continue resuscitation proceeded and patient required external pacing and numerous rounds of epinephrine.  Return of spontaneous circulation was obtained however patient had very poor contractility.   Family was updated and made aware the patient's poor prognosis.  Family chose to withhold any additional resuscitation if patient goes into cardiac arrest again.  Minutes after this conversation, the patient's cardiac activity ceased.  Confirmed by bedside echo.  Time of death was called at 0 712 am.   Final Clinical Impression(s) / ED Diagnoses Final diagnoses:  Cardiac arrest [facility])      This chart was dictated using voice recognition software.  Despite best efforts to proofread,  errors can occur which can change the documentation meaning.   Fatima Blank, MD 08/08/2018 [phone]

## 2018-08-14 NOTE — Code Documentation (Signed)
Pacing D/c  CPR initiated

## 2018-08-14 NOTE — Progress Notes (Signed)
   08/02/2018 0700  Clinical Encounter Type  Visited With Patient;Patient and family together  Visit Type Initial  Referral From Nurse  Consult/Referral To Chaplain  Spiritual Encounters  Spiritual Needs Emotional;Grief support;Prayer  Stress Factors  Family Stress Factors Major life changes;Loss   Responded to page to [facility] room for family support for PT passing. Family was in consult room B and I escorted them back to the [facility] room to see PT. Family stated they will let Nurse know about their wishes about PT placement prior to their departure. I offered spiritual care with ministry of presence, words of comfort, empathic listening, and prayer. Family was very thankful for the professionalism of staff and Chaplain presence.  Chaplain Fidel Levy [phone]

## 2018-08-14 NOTE — Code Documentation (Signed)
Dobutamine increased to 70mcg/kg/min

## 2018-08-14 NOTE — Code Documentation (Addendum)
Dobutamine started at 26mcg/kg/min.

## 2018-08-14 NOTE — ED Notes (Signed)
Lucas Miller with pt placement notified and she advised the pt was ready for the morgue.

## 2018-08-14 NOTE — ED Notes (Signed)
BIB EMS from home, Post CPR. Per wife pt got up this AM and went to bathroom, had episode of diarrhea, then went unresponsive. Call went out 0506, estimating 10 min of downtime before CPR started by EMS. ROSC achieved 0519, lost pulses CPR again for 7 min, ROSC at 0526. BP initially 40's, given several epi push en route. Upon arrival pt moving extremities but will not follow commands. EDP at RT at bedside, switching out king airway, pt began to brady down and CPR reinitiated.

## 2018-08-14 NOTE — Consult Note (Signed)
Cardiology Consult    Patient ID: [name] MRN: [id number], DOB/AGE: May 13, 1935   Admit date: 08/07/2018 Date of Consult: 08/28/2018  Primary Physician: Nicoletta Dress, MD Primary Cardiologist: No primary care provider on file.  Patient Profile    Lucas Miller is a 84 y.o. male with cardiac arrest  Past Medical History   Past Medical History:  Diagnosis Date  . Glaucoma     History reviewed. No pertinent surgical history.   Allergies  Allergies  Allergen Reactions  . Codeine   . Penicillins   . Sulfa Antibiotics     History of Present Illness    43 y o man with history of stroke and smoking. presents after witnessed arrest. Wanted to go out smoking. Was gray for the preceding hour.  Wife noted him to suddenly collapse. He had agonal breathing. EMS called. Estimated CPR of 15 min. PEA was presenting rhythm. Per EMS no recent flu like symptoms, no complaints of CP.   IN the [facility] ED he arrested again. 5 min on CPR for PEA.  Inpatient Medications    2 rounds of EPI  Family History    No family history on file. has no family status information on file.    Social History    Social History   Socioeconomic History  . Marital status: Married    Spouse name: Not on file  . Number of children: Not on file  . Years of education: Not on file  . Highest education level: Not on file  Occupational History  . Not on file  Social Needs  . Financial resource strain: Not on file  . Food insecurity:    Worry: Not on file    Inability: Not on file  . Transportation needs:    Medical: Not on file    Non-medical: Not on file  Tobacco Use  . Smoking status: Never Smoker  . Smokeless tobacco: Never Used  Substance and Sexual Activity  . Alcohol use: No    Alcohol/week: 0.0 standard drinks  . Drug use: No  . Sexual activity: Not on file  Lifestyle  . Physical activity:    Days per week: Not on file    Minutes per session: Not on file  . Stress: Not on  file  Relationships  . Social connections:    Talks on phone: Not on file    Gets together: Not on file    Attends religious service: Not on file    Active member of club or organization: Not on file    Attends meetings of clubs or organizations: Not on file    Relationship status: Not on file  . Intimate partner violence:    Fear of current or ex partner: Not on file    Emotionally abused: Not on file    Physically abused: Not on file    Forced sexual activity: Not on file  Other Topics Concern  . Not on file  Social History Narrative  . Not on file     Review of Systems    General:  No chills, fever, night sweats or weight changes.  Cardiovascular:  No chest pain, dyspnea on exertion, edema, orthopnea, palpitations, paroxysmal nocturnal dyspnea. Dermatological: No rash, lesions/masses Respiratory: No cough, dyspnea Urologic: No hematuria, dysuria Abdominal:   No nausea, vomiting, diarrhea, bright red blood per rectum, melena, or hematemesis Neurologic:  No visual changes, wkns, changes in mental status. All other systems reviewed and are otherwise negative except as noted above.  Physical Exam    There were no vitals taken for this visit.  General: Intub and sedated Psych cant assess Neuro:sedated HEENT: Normal  Neck: Supple without bruits or JVD. Lungs:  mech breath sounds Heart: distant Abdomen: Soft, non-tender, non-distended, BS + x 4.  Extremities: No clubbing, cyanosis or edema. DP/PT/Radials 2+ and equal bilaterally.  Labs    Troponin (Point of Care Test) No results for input(s): TROPIPOC in the last 72 hours. No results for input(s): CKTOTAL, CKMB, TROPONINI in the last 72 hours. No results found for: WBC, HGB, HCT, MCV, PLT No results for input(s): NA, K, CL, CO2, BUN, CREATININE, CALCIUM, PROT, BILITOT, ALKPHOS, ALT, AST, GLUCOSE in the last 168 hours.  Invalid input(s): LABALBU No results found for: CHOL, HDL, LDLCALC, TRIG No results found for:  [facility]   Radiology Studies    No results found.  ECG & Cardiac Imaging    Aflutter with PVC, RBBB, no obvious ischemic signs  Assessment & Plan    PEA arrest. Unknown cause. Possible MI, PE,. Now s/p 3 ronds of CPR. Remains critically ill. Bedside echo with LVEF <15. Global dysfunction.   See no role for LHC now given his unstable state. Can consider at later time point if he to survive next 12-24h.   Signed, Cristina Gong, MD 08/13/2018, 6:31 AM  For questions or updates, please contact   Please consult [email or web address] for contact info under Cardiology/STEMI.

## 2018-08-14 NOTE — Code Documentation (Signed)
CPR initiated

## 2018-08-14 NOTE — Code Documentation (Signed)
Dobutamine increased to 61mcg/kg/min

## 2018-08-16 MED FILL — Norepinephrine Bitartrate IV Soln 1 MG/ML (Base Equivalent): INTRAVENOUS | Qty: 4 | Status: AC

## 2018-09-01 DEATH — deceased

## 2018-09-23 ENCOUNTER — Ambulatory Visit: Payer: Medicare Other | Admitting: Sports Medicine

## 2020-03-16 IMAGING — DX PORTABLE CHEST - 1 VIEW
1 series · 1 of 1 positions shown · non-contrast
Comparison: None.

CLINICAL DATA: Tube placement, post CPR

EXAM:
PORTABLE CHEST 1 VIEW

[chest]
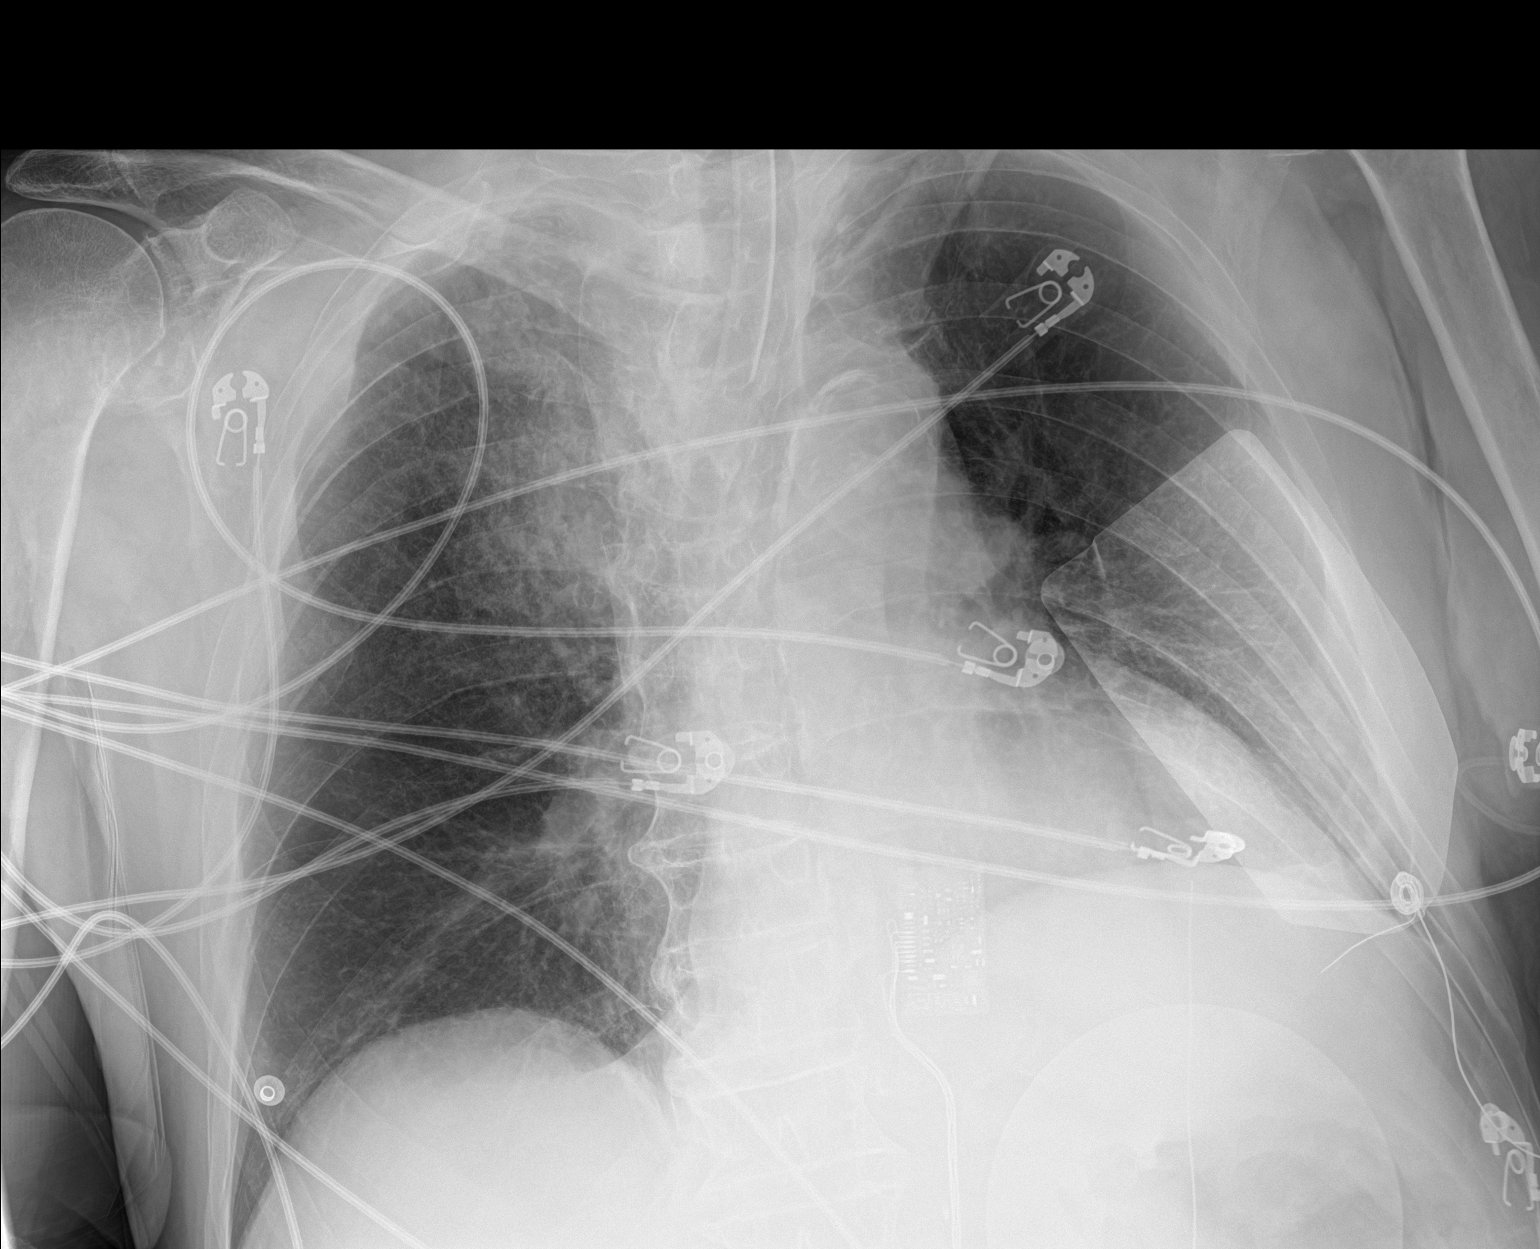

[1 of 1 positions shown; findings below may reference images not displayed]

FINDINGS: Endotracheal tube terminates 5 cm above the carina.

Mild right upper lobe and left lower lobe opacities, possibly
reflecting mild aspiration. No frank interstitial edema. No pleural
effusion pneumothorax.

Heart is normal in size.

Defibrillator pads overlying the left hemithorax.

Degenerative changes of the visualized thoracic spine.
IMPRESSION: Endotracheal tube terminates 5 cm above the carina.

Mild right upper lobe and left lower lobe opacities, possibly
reflecting mild aspiration.
